# Patient Record
Sex: Male | Born: 1955 | Race: White | Hispanic: No | Marital: Married | Smoking: Former smoker
Health system: Southern US, Community
[De-identification: ages and names within clinical notes are randomized; demographics above are authoritative.]

## PROBLEM LIST (undated history)

## (undated) DIAGNOSIS — R251 Tremor, unspecified: Secondary | ICD-10-CM

## (undated) DIAGNOSIS — G709 Myoneural disorder, unspecified: Secondary | ICD-10-CM

## (undated) DIAGNOSIS — M1990 Unspecified osteoarthritis, unspecified site: Secondary | ICD-10-CM

## (undated) DIAGNOSIS — L719 Rosacea, unspecified: Secondary | ICD-10-CM

## (undated) DIAGNOSIS — G450 Vertebro-basilar artery syndrome: Secondary | ICD-10-CM

## (undated) DIAGNOSIS — I10 Essential (primary) hypertension: Secondary | ICD-10-CM

## (undated) DIAGNOSIS — E119 Type 2 diabetes mellitus without complications: Secondary | ICD-10-CM

## (undated) DIAGNOSIS — J449 Chronic obstructive pulmonary disease, unspecified: Secondary | ICD-10-CM

## (undated) DIAGNOSIS — H269 Unspecified cataract: Secondary | ICD-10-CM

## (undated) HISTORY — DX: Chronic obstructive pulmonary disease, unspecified: J44.9

## (undated) HISTORY — PX: CARPAL TUNNEL RELEASE: SHX101

## (undated) HISTORY — DX: Rosacea, unspecified: L71.9

## (undated) HISTORY — DX: Tremor, unspecified: R25.1

## (undated) HISTORY — DX: Unspecified osteoarthritis, unspecified site: M19.90

## (undated) HISTORY — PX: TONSILLECTOMY: SUR1361

## (undated) HISTORY — PX: ARTHROSCOPY, KNEE, WITH LOOSE BODY REMOVAL: SHX7683

## (undated) HISTORY — PX: EYE SURGERY: SHX253

## (undated) HISTORY — DX: Vertebro-basilar artery syndrome: G45.0

## (undated) HISTORY — PX: KNEE SURGERY: SHX244

---

## 1989-08-28 HISTORY — PX: NASAL SINUS SURGERY: SHX719

## 1993-08-28 HISTORY — PX: ANTERIOR FUSION CERVICAL SPINE: SUR626

## 2000-06-22 ENCOUNTER — Ambulatory Visit (HOSPITAL_BASED_OUTPATIENT_CLINIC_OR_DEPARTMENT_OTHER): Admission: RE | Admit: 2000-06-22 | Discharge: 2000-06-22 | Payer: Self-pay | Admitting: Internal Medicine

## 2000-09-14 ENCOUNTER — Ambulatory Visit (HOSPITAL_COMMUNITY): Admission: RE | Admit: 2000-09-14 | Discharge: 2000-09-15 | Payer: Self-pay | Admitting: Otolaryngology

## 2002-08-13 ENCOUNTER — Encounter: Payer: Self-pay | Admitting: *Deleted

## 2002-08-13 ENCOUNTER — Observation Stay (HOSPITAL_COMMUNITY): Admission: EM | Admit: 2002-08-13 | Discharge: 2002-08-14 | Payer: Self-pay | Admitting: Emergency Medicine

## 2006-08-03 ENCOUNTER — Encounter: Admission: RE | Admit: 2006-08-03 | Discharge: 2006-08-03 | Payer: Self-pay | Admitting: Family Medicine

## 2006-08-06 ENCOUNTER — Encounter: Admission: RE | Admit: 2006-08-06 | Discharge: 2006-08-06 | Payer: Self-pay | Admitting: Family Medicine

## 2006-09-09 ENCOUNTER — Emergency Department (HOSPITAL_COMMUNITY): Admission: EM | Admit: 2006-09-09 | Discharge: 2006-09-09 | Payer: Self-pay | Admitting: Emergency Medicine

## 2006-09-11 ENCOUNTER — Ambulatory Visit: Payer: Self-pay

## 2011-04-19 ENCOUNTER — Other Ambulatory Visit: Payer: Self-pay | Admitting: Family Medicine

## 2011-04-19 DIAGNOSIS — R911 Solitary pulmonary nodule: Secondary | ICD-10-CM

## 2011-04-21 ENCOUNTER — Ambulatory Visit
Admission: RE | Admit: 2011-04-21 | Discharge: 2011-04-21 | Disposition: A | Discharge status: Home / Self Care | Payer: BC Managed Care – PPO | Source: Ambulatory Visit | Attending: Family Medicine | Admitting: Family Medicine

## 2011-04-21 DIAGNOSIS — R911 Solitary pulmonary nodule: Secondary | ICD-10-CM

## 2011-05-19 ENCOUNTER — Other Ambulatory Visit: Payer: Self-pay | Admitting: Gastroenterology

## 2013-03-01 IMAGING — CT CT CHEST W/O CM
2 of 3 series · 15 of 36 positions shown, 18 images · non-contrast
Comparison: 08/06/2006 chest CT.

CLINICAL DATA: Follow-up pulmonary nodule.  Former smoker.

CT CHEST WITHOUT CONTRAST
TECHNIQUE: Multidetector CT imaging of the chest was performed
following the standard protocol without IV contrast.

[Series 3: routine chest · axial · 0.87mm/px · z∈[-283,-3]mm · 12 of 66 slices shown, 15 images]
[im 5/66  mediastinal]
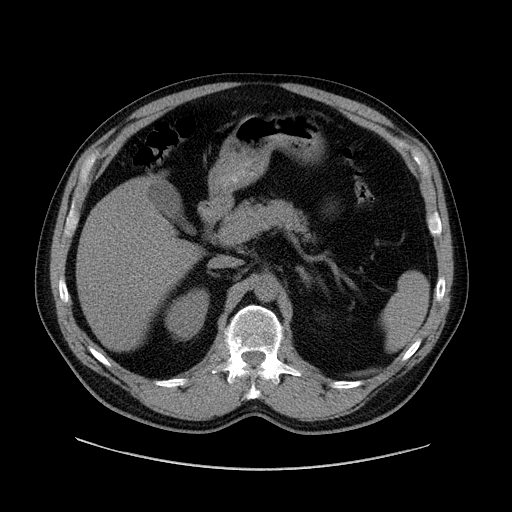
[im 5/66  lung]
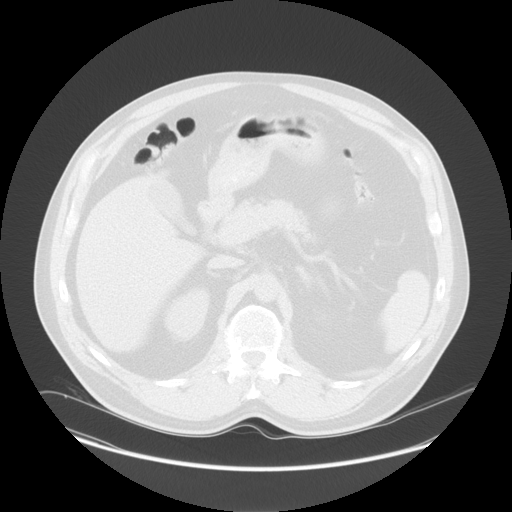
[im 10/66  lung]
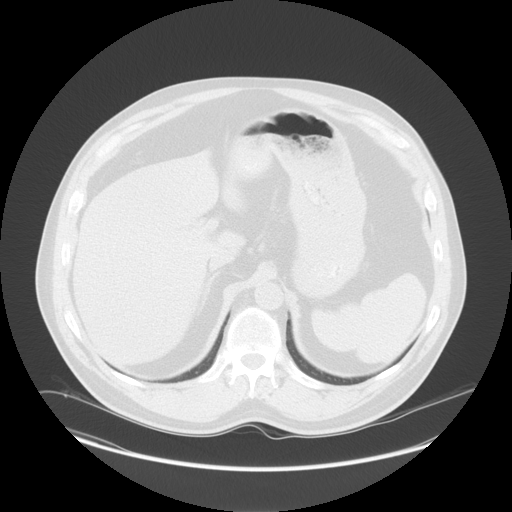
[im 15/66  lung]
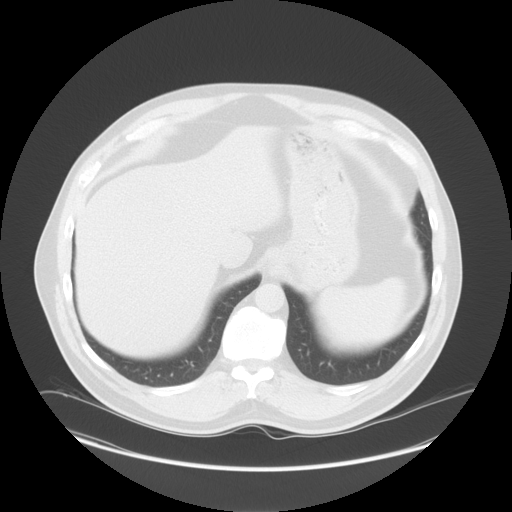
[im 20/66  lung]
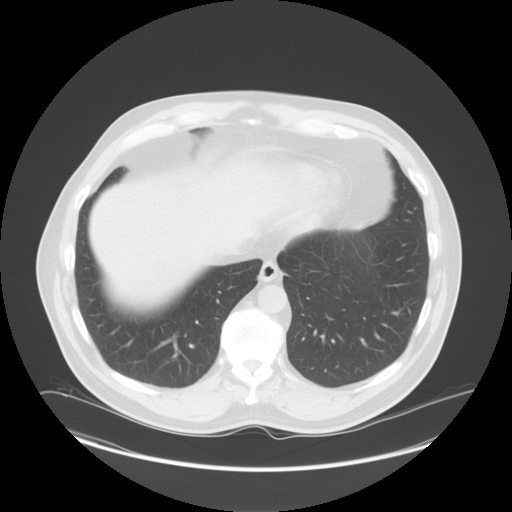
[im 25/66  mediastinal]
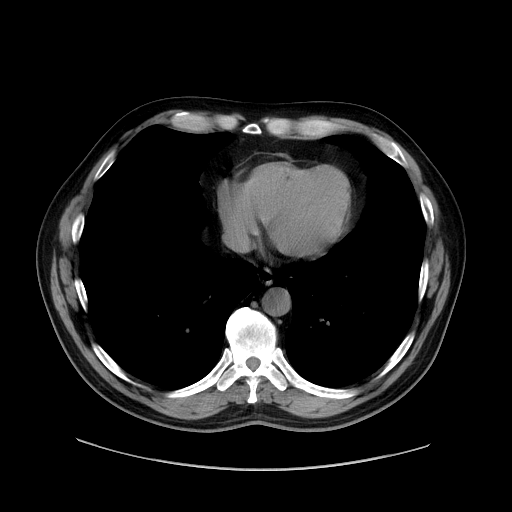
[im 25/66  lung]
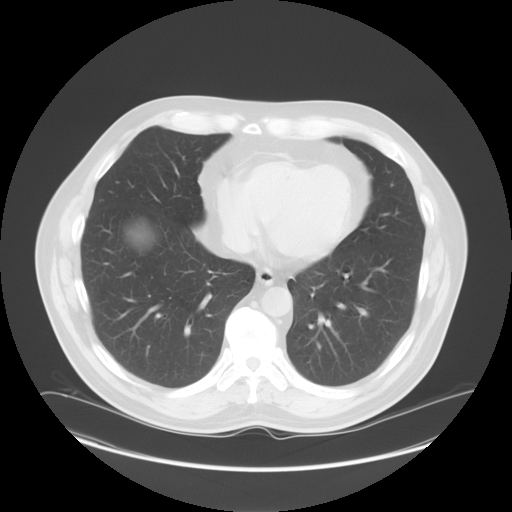
[im 29/66  lung]
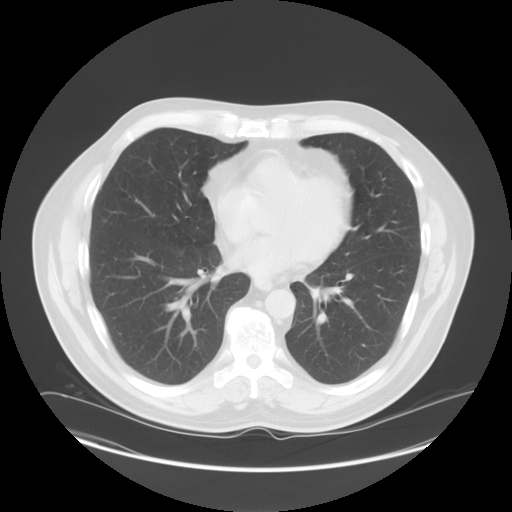
[im 37/66  lung]
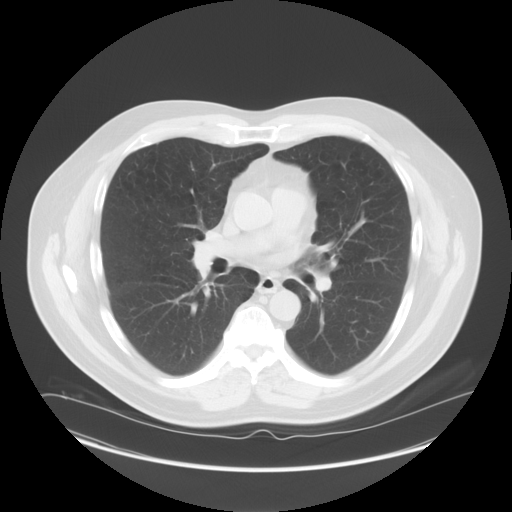
[im 41/66  lung]
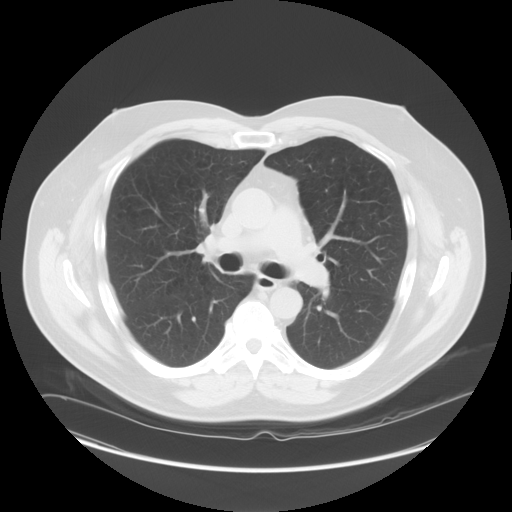
[im 46/66  mediastinal]
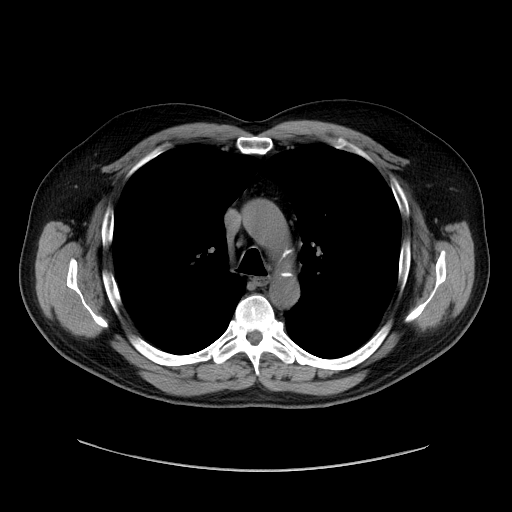
[im 46/66  lung]
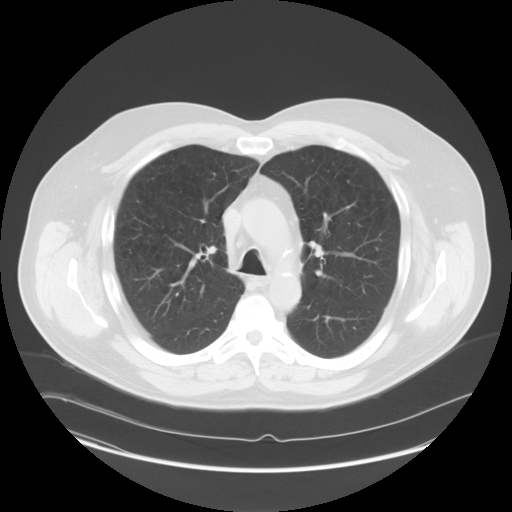
[im 51/66  lung]
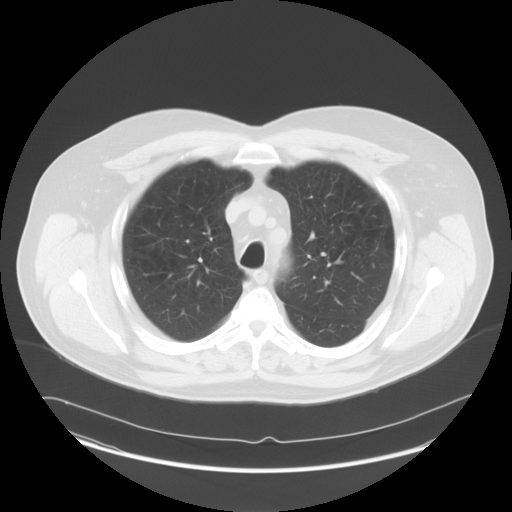
[im 56/66  lung]
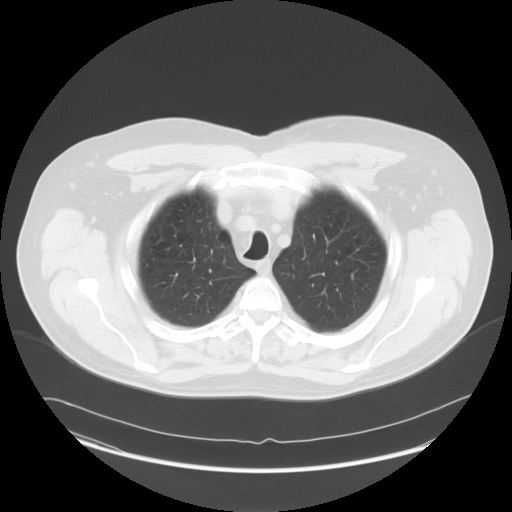
[im 61/66  lung]
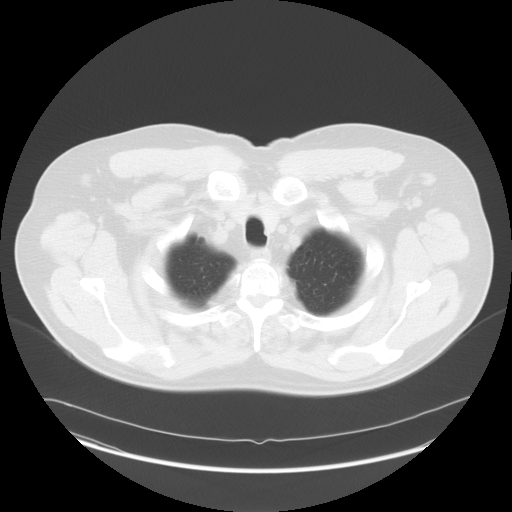

[Series 200: coronals · coronal · 0.87mm/px · 3 of 133 slices shown]
[im 27/133  lung]
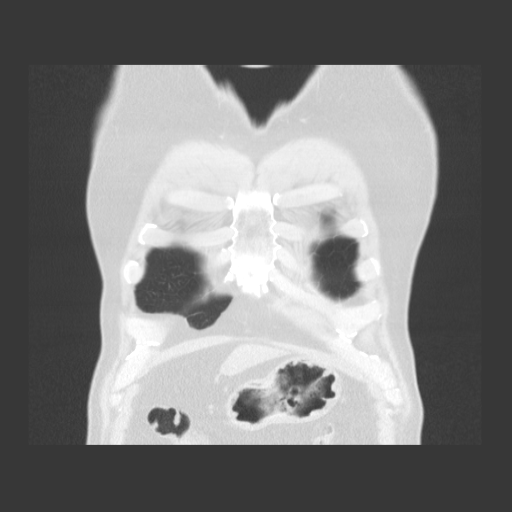
[im 53/133  lung]
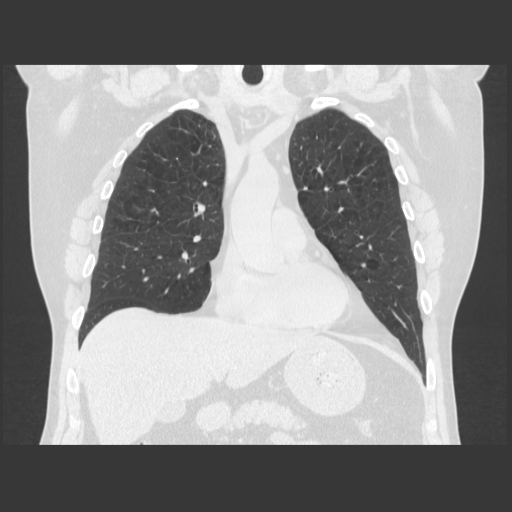
[im 80/133  lung]
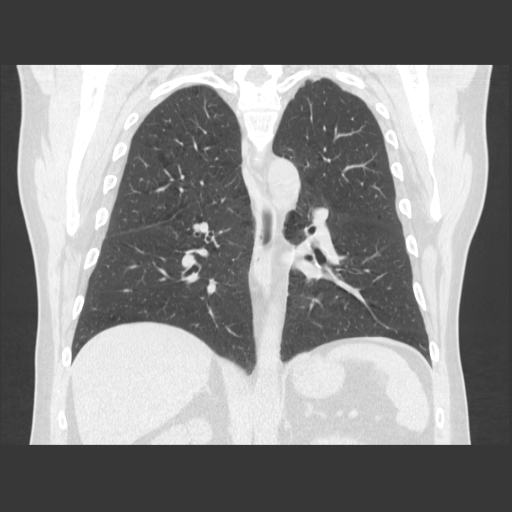

[15 of 36 positions shown; findings below may reference images not displayed]

FINDINGS: Normal sized mediastinal lymph nodes are unchanged.
There is no significant atherosclerosis.  There is no pleural or
pericardial effusion.

Mild emphysematous changes are noted throughout the lungs.  2 mm
right upper lobe nodule on image 36 is unchanged.  There is a tiny
left upper lobe nodule on image 27 which is also unchanged.  Small
focus of pleural thickening along the superior aspect of the right
major fissure on image 19 is stable.  There are no new, enlarging
or suspicious pulmonary nodules.

Images through the upper abdomen demonstrate no adrenal mass.
There is hepatic low density consistent with steatosis.
IMPRESSION: 1.  Stable mild emphysema.
2.  Stable tiny pulmonary nodules from 9664 consistent with a
benign etiology, likely postinflammatory.  No suspicious nodules.
3.  Mild hepatic steatosis.

## 2016-04-10 DIAGNOSIS — L280 Lichen simplex chronicus: Secondary | ICD-10-CM | POA: Diagnosis not present

## 2016-04-10 DIAGNOSIS — L570 Actinic keratosis: Secondary | ICD-10-CM | POA: Diagnosis not present

## 2016-04-10 DIAGNOSIS — D225 Melanocytic nevi of trunk: Secondary | ICD-10-CM | POA: Diagnosis not present

## 2016-04-10 DIAGNOSIS — D1801 Hemangioma of skin and subcutaneous tissue: Secondary | ICD-10-CM | POA: Diagnosis not present

## 2016-04-10 DIAGNOSIS — L718 Other rosacea: Secondary | ICD-10-CM | POA: Diagnosis not present

## 2016-04-10 DIAGNOSIS — L821 Other seborrheic keratosis: Secondary | ICD-10-CM | POA: Diagnosis not present

## 2016-04-10 DIAGNOSIS — D1800 Hemangioma unspecified site: Secondary | ICD-10-CM | POA: Diagnosis not present

## 2016-08-08 DIAGNOSIS — Z0000 Encounter for general adult medical examination without abnormal findings: Secondary | ICD-10-CM | POA: Diagnosis not present

## 2016-08-09 DIAGNOSIS — Z125 Encounter for screening for malignant neoplasm of prostate: Secondary | ICD-10-CM | POA: Diagnosis not present

## 2016-08-09 DIAGNOSIS — E7800 Pure hypercholesterolemia, unspecified: Secondary | ICD-10-CM | POA: Diagnosis not present

## 2016-08-09 DIAGNOSIS — Z0000 Encounter for general adult medical examination without abnormal findings: Secondary | ICD-10-CM | POA: Diagnosis not present

## 2016-11-27 DIAGNOSIS — R739 Hyperglycemia, unspecified: Secondary | ICD-10-CM | POA: Diagnosis not present

## 2016-12-25 DIAGNOSIS — Z86010 Personal history of colonic polyps: Secondary | ICD-10-CM | POA: Diagnosis not present

## 2016-12-25 DIAGNOSIS — D126 Benign neoplasm of colon, unspecified: Secondary | ICD-10-CM | POA: Diagnosis not present

## 2016-12-28 DIAGNOSIS — D126 Benign neoplasm of colon, unspecified: Secondary | ICD-10-CM | POA: Diagnosis not present

## 2017-01-06 DIAGNOSIS — J0100 Acute maxillary sinusitis, unspecified: Secondary | ICD-10-CM | POA: Diagnosis not present

## 2017-04-02 DIAGNOSIS — E7800 Pure hypercholesterolemia, unspecified: Secondary | ICD-10-CM | POA: Diagnosis not present

## 2017-04-02 DIAGNOSIS — R7303 Prediabetes: Secondary | ICD-10-CM | POA: Diagnosis not present

## 2017-04-13 DIAGNOSIS — H01004 Unspecified blepharitis left upper eyelid: Secondary | ICD-10-CM | POA: Diagnosis not present

## 2017-04-13 DIAGNOSIS — H01005 Unspecified blepharitis left lower eyelid: Secondary | ICD-10-CM | POA: Diagnosis not present

## 2019-06-13 DIAGNOSIS — D3617 Benign neoplasm of peripheral nerves and autonomic nervous system of trunk, unspecified: Secondary | ICD-10-CM | POA: Diagnosis not present

## 2019-06-13 DIAGNOSIS — L309 Dermatitis, unspecified: Secondary | ICD-10-CM | POA: Diagnosis not present

## 2019-06-13 DIAGNOSIS — L820 Inflamed seborrheic keratosis: Secondary | ICD-10-CM | POA: Diagnosis not present

## 2019-06-13 DIAGNOSIS — L570 Actinic keratosis: Secondary | ICD-10-CM | POA: Diagnosis not present

## 2019-06-13 DIAGNOSIS — D235 Other benign neoplasm of skin of trunk: Secondary | ICD-10-CM | POA: Diagnosis not present

## 2019-11-16 DIAGNOSIS — Z20828 Contact with and (suspected) exposure to other viral communicable diseases: Secondary | ICD-10-CM | POA: Diagnosis not present

## 2019-11-16 DIAGNOSIS — Z03818 Encounter for observation for suspected exposure to other biological agents ruled out: Secondary | ICD-10-CM | POA: Diagnosis not present

## 2019-11-22 DIAGNOSIS — Z03818 Encounter for observation for suspected exposure to other biological agents ruled out: Secondary | ICD-10-CM | POA: Diagnosis not present

## 2019-11-22 DIAGNOSIS — R6520 Severe sepsis without septic shock: Secondary | ICD-10-CM | POA: Diagnosis not present

## 2019-11-22 DIAGNOSIS — R0600 Dyspnea, unspecified: Secondary | ICD-10-CM | POA: Diagnosis not present

## 2019-11-22 DIAGNOSIS — E871 Hypo-osmolality and hyponatremia: Secondary | ICD-10-CM | POA: Diagnosis not present

## 2019-11-22 DIAGNOSIS — Z87891 Personal history of nicotine dependence: Secondary | ICD-10-CM | POA: Diagnosis not present

## 2019-11-22 DIAGNOSIS — Z7982 Long term (current) use of aspirin: Secondary | ICD-10-CM | POA: Diagnosis not present

## 2019-11-22 DIAGNOSIS — J1282 Pneumonia due to coronavirus disease 2019: Secondary | ICD-10-CM | POA: Diagnosis not present

## 2019-11-22 DIAGNOSIS — R739 Hyperglycemia, unspecified: Secondary | ICD-10-CM | POA: Diagnosis not present

## 2019-11-22 DIAGNOSIS — J439 Emphysema, unspecified: Secondary | ICD-10-CM | POA: Diagnosis not present

## 2019-11-22 DIAGNOSIS — R7401 Elevation of levels of liver transaminase levels: Secondary | ICD-10-CM | POA: Diagnosis not present

## 2019-11-22 DIAGNOSIS — U071 COVID-19: Secondary | ICD-10-CM | POA: Diagnosis not present

## 2019-11-22 DIAGNOSIS — I499 Cardiac arrhythmia, unspecified: Secondary | ICD-10-CM | POA: Diagnosis not present

## 2019-11-22 DIAGNOSIS — J9691 Respiratory failure, unspecified with hypoxia: Secondary | ICD-10-CM | POA: Diagnosis not present

## 2019-11-22 DIAGNOSIS — A419 Sepsis, unspecified organism: Secondary | ICD-10-CM | POA: Diagnosis not present

## 2019-11-22 DIAGNOSIS — J9602 Acute respiratory failure with hypercapnia: Secondary | ICD-10-CM | POA: Diagnosis not present

## 2019-11-22 DIAGNOSIS — R069 Unspecified abnormalities of breathing: Secondary | ICD-10-CM | POA: Diagnosis not present

## 2019-11-22 DIAGNOSIS — E669 Obesity, unspecified: Secondary | ICD-10-CM | POA: Diagnosis not present

## 2019-11-22 DIAGNOSIS — R0602 Shortness of breath: Secondary | ICD-10-CM | POA: Diagnosis not present

## 2019-11-22 DIAGNOSIS — Z6829 Body mass index (BMI) 29.0-29.9, adult: Secondary | ICD-10-CM | POA: Diagnosis not present

## 2019-11-22 DIAGNOSIS — B342 Coronavirus infection, unspecified: Secondary | ICD-10-CM | POA: Diagnosis not present

## 2019-11-22 DIAGNOSIS — J80 Acute respiratory distress syndrome: Secondary | ICD-10-CM | POA: Diagnosis not present

## 2019-11-22 DIAGNOSIS — A4189 Other specified sepsis: Secondary | ICD-10-CM | POA: Diagnosis not present

## 2019-11-22 DIAGNOSIS — D649 Anemia, unspecified: Secondary | ICD-10-CM | POA: Diagnosis not present

## 2019-11-22 DIAGNOSIS — J9601 Acute respiratory failure with hypoxia: Secondary | ICD-10-CM | POA: Diagnosis not present

## 2019-11-23 DIAGNOSIS — J9601 Acute respiratory failure with hypoxia: Secondary | ICD-10-CM | POA: Diagnosis not present

## 2019-11-23 DIAGNOSIS — U071 COVID-19: Secondary | ICD-10-CM | POA: Diagnosis not present

## 2019-11-23 DIAGNOSIS — J1282 Pneumonia due to coronavirus disease 2019: Secondary | ICD-10-CM | POA: Diagnosis not present

## 2019-11-24 DIAGNOSIS — J1282 Pneumonia due to coronavirus disease 2019: Secondary | ICD-10-CM | POA: Diagnosis not present

## 2019-11-24 DIAGNOSIS — J9601 Acute respiratory failure with hypoxia: Secondary | ICD-10-CM | POA: Diagnosis not present

## 2019-11-24 DIAGNOSIS — U071 COVID-19: Secondary | ICD-10-CM | POA: Diagnosis not present

## 2019-11-29 DIAGNOSIS — J9601 Acute respiratory failure with hypoxia: Secondary | ICD-10-CM | POA: Diagnosis not present

## 2019-11-29 DIAGNOSIS — U071 COVID-19: Secondary | ICD-10-CM | POA: Diagnosis not present

## 2019-11-29 DIAGNOSIS — J1282 Pneumonia due to coronavirus disease 2019: Secondary | ICD-10-CM | POA: Diagnosis not present

## 2019-11-30 DIAGNOSIS — J439 Emphysema, unspecified: Secondary | ICD-10-CM | POA: Diagnosis not present

## 2019-11-30 DIAGNOSIS — U071 COVID-19: Secondary | ICD-10-CM | POA: Diagnosis not present

## 2019-11-30 DIAGNOSIS — J9601 Acute respiratory failure with hypoxia: Secondary | ICD-10-CM | POA: Diagnosis not present

## 2019-11-30 DIAGNOSIS — J1282 Pneumonia due to coronavirus disease 2019: Secondary | ICD-10-CM | POA: Diagnosis not present

## 2019-12-01 DIAGNOSIS — U071 COVID-19: Secondary | ICD-10-CM | POA: Diagnosis not present

## 2019-12-01 DIAGNOSIS — J1282 Pneumonia due to coronavirus disease 2019: Secondary | ICD-10-CM | POA: Diagnosis not present

## 2019-12-01 DIAGNOSIS — J439 Emphysema, unspecified: Secondary | ICD-10-CM | POA: Diagnosis not present

## 2019-12-01 DIAGNOSIS — J9601 Acute respiratory failure with hypoxia: Secondary | ICD-10-CM | POA: Diagnosis not present

## 2019-12-02 DIAGNOSIS — J1282 Pneumonia due to coronavirus disease 2019: Secondary | ICD-10-CM | POA: Diagnosis not present

## 2019-12-02 DIAGNOSIS — A419 Sepsis, unspecified organism: Secondary | ICD-10-CM | POA: Diagnosis not present

## 2019-12-02 DIAGNOSIS — U071 COVID-19: Secondary | ICD-10-CM | POA: Diagnosis not present

## 2019-12-02 DIAGNOSIS — R6520 Severe sepsis without septic shock: Secondary | ICD-10-CM | POA: Diagnosis not present

## 2019-12-08 DIAGNOSIS — U071 COVID-19: Secondary | ICD-10-CM | POA: Diagnosis not present

## 2019-12-08 DIAGNOSIS — J449 Chronic obstructive pulmonary disease, unspecified: Secondary | ICD-10-CM | POA: Diagnosis not present

## 2019-12-26 DIAGNOSIS — H524 Presbyopia: Secondary | ICD-10-CM | POA: Diagnosis not present

## 2020-01-12 DIAGNOSIS — R7303 Prediabetes: Secondary | ICD-10-CM | POA: Diagnosis not present

## 2020-01-12 DIAGNOSIS — Z0000 Encounter for general adult medical examination without abnormal findings: Secondary | ICD-10-CM | POA: Diagnosis not present

## 2020-01-12 DIAGNOSIS — Z13220 Encounter for screening for lipoid disorders: Secondary | ICD-10-CM | POA: Diagnosis not present

## 2020-01-12 DIAGNOSIS — Z125 Encounter for screening for malignant neoplasm of prostate: Secondary | ICD-10-CM | POA: Diagnosis not present

## 2020-04-28 ENCOUNTER — Ambulatory Visit: Payer: BC Managed Care – PPO | Admitting: Orthopaedic Surgery

## 2020-04-28 ENCOUNTER — Other Ambulatory Visit: Payer: Self-pay

## 2020-04-28 ENCOUNTER — Encounter: Payer: Self-pay | Admitting: Orthopaedic Surgery

## 2020-04-28 ENCOUNTER — Ambulatory Visit: Payer: Self-pay

## 2020-04-28 VITALS — Ht 75.0 in | Wt 235.0 lb

## 2020-04-28 DIAGNOSIS — G8929 Other chronic pain: Secondary | ICD-10-CM

## 2020-04-28 DIAGNOSIS — M79671 Pain in right foot: Secondary | ICD-10-CM | POA: Diagnosis not present

## 2020-04-28 DIAGNOSIS — M79674 Pain in right toe(s): Secondary | ICD-10-CM | POA: Diagnosis not present

## 2020-04-28 DIAGNOSIS — M25512 Pain in left shoulder: Secondary | ICD-10-CM | POA: Diagnosis not present

## 2020-04-28 MED ORDER — BUPIVACAINE HCL 0.5 % IJ SOLN
2.0000 mL | INTRAMUSCULAR | Status: AC | PRN
  Administered 2020-04-28: 2 mL via INTRA_ARTICULAR

## 2020-04-28 MED ORDER — METHYLPREDNISOLONE ACETATE 40 MG/ML IJ SUSP
80.0000 mg | INTRAMUSCULAR | Status: AC | PRN
  Administered 2020-04-28: 80 mg via INTRA_ARTICULAR

## 2020-04-28 MED ORDER — LIDOCAINE HCL 2 % IJ SOLN
2.0000 mL | INTRAMUSCULAR | Status: AC | PRN
  Administered 2020-04-28: 2 mL

## 2020-04-28 NOTE — Progress Notes (Signed)
Office Visit Note   Patient: Darrell Larsen           Date of Birth: Oct 03, 1955           MRN: [id number] Visit Date: 04/28/2020              Requested by: No referring provider defined for this encounter. PCP: Blair Heys, MD   Assessment & Plan: Visit Diagnoses:  1. Chronic left shoulder pain   2. Pain in right foot   3. Toe pain, chronic, right     Plan: Impingement syndrome left shoulder.  Possible rotator cuff tear.  Appears to have a hernia of fibers of the deltoid that are not symptomatic.  Will inject the subacromial space with cortisone and monitor response over the next several weeks.  If no improvement will order MRI scan.  Has underlapping of the right little toe on the fourth toe and is developed a painful corn.  Will place cotton between the 2 toes and have him tape them and monitor the his response.  Might be a candidate for possible fusion of the fourth of the fifth toe  Follow-Up Instructions: Return if symptoms worsen or fail to improve.   Orders:  Orders Placed This Encounter  Procedures  . Large Joint Inj: L subacromial bursa  . XR Shoulder Left  . XR Foot Complete Right   No orders of the defined types were placed in this encounter.     Procedures: Large Joint Inj: L subacromial bursa on 04/28/2020 11:35 AM Indications: pain and diagnostic evaluation Details: 25 G 1.5 in needle, anterolateral approach  Arthrogram: No  Medications: 2 mL lidocaine 2 %; 2 mL bupivacaine 0.5 %; 80 mg methylPREDNISolone acetate 40 MG/ML Consent was given by the patient. Immediately prior to procedure a time out was called to verify the correct patient, procedure, equipment, support staff and site/side marked as required. Patient was prepped and draped in the usual sterile fashion.       Clinical Data: No additional findings.   Subjective: Chief Complaint  Patient presents with  . Left Shoulder - Pain  Patient presents today for left shoulder pain. No known  injury. It has been hurting for at least 2-3 weeks. He has a knot on the anterior side of his shoulder. He has pain all throughout his shoulder that is worse with use. He has limited range of motion. No numbness, tingling, or weakness. He does not take anything for pain. He is right hand dominant. No previous shoulder surgery.   He also wants to have his right foot checked out today. He states that his fifth toe curls underneath the fourth toe. He said that it has been doing this for years, but worsening. He has pain with walking. No previous foot surgery.   HPI  Review of Systems   Objective: Vital Signs: Ht 6\' 3"  (1.905 m)   Wt 235 lb (106.6 kg)   BMI 29.37 kg/m   Physical Exam Constitutional:      Appearance: He is well-developed.  Eyes:     Pupils: Pupils are equal, round, and reactive to light.  Pulmonary:     Effort: Pulmonary effort is normal.  Skin:    General: Skin is warm and dry.  Neurological:     Mental Status: He is alert and oriented to person, place, and time.  Psychiatric:        Behavior: Behavior normal.     Ortho Exam left shoulder with positive impingement  and empty can testing.  Biceps intact.  Good strength and negative Speed sign.  Has a prominence of the deltoid fibers anteriorly but without any pain.  I am not sure if this is a muscle hernia or a cyst.  Again, no pain.  Minimal subacromial tenderness anteriorly.  No pain at the [facility] joint.  Does have pain with abduction in the lateral subacromial region.  Full overhead motion.  Good strength with internal and external rotation and good grip.  No crepitation with shoulder motion.  Right foot with underlapping of the fifth on the fourth toe.  Does have a painful corn on the fourth toe where it abuts against the fifth as it under laps.  Able to correct the position to neutral passively Specialty Comments:  No specialty comments available.  Imaging: XR Foot Complete Right  Result Date: 04/28/2020 Films of  the right foot were obtained in several projections.  There are degenerative changes at the first metatarsal phalangeal joint with flattening of the joint surfaces on both sides and peripheral osteophytes.  There is narrowing of the joint space.  Patient is not symptomatic in that area.  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  No acute changes  XR Shoulder Left  Result Date: 04/28/2020 Films of the left shoulder were obtained in several projections.  Humeral head is centered about the glenoid.  No ectopic calcification.  Normal space between the humeral head and the acromium.  There are some degenerative changes at the [facility] joint that are relatively mild.  Type II acromium no acute changes    PMFS History: Patient Active Problem List   Diagnosis Date Noted  . Pain in left shoulder 04/28/2020  . Toe pain, chronic, right 04/28/2020   History reviewed. No pertinent past medical history.  History reviewed. No pertinent family history.  Past Surgical History:  Procedure Laterality Date  . ANTERIOR FUSION CERVICAL SPINE  1995  . KNEE SURGERY Right 12/18/20008   Social History   Occupational History  . Not on file  Tobacco Use  . Smoking status: Never Smoker  . Smokeless tobacco: Never Used  Substance and Sexual Activity  . Alcohol use: Not on file    Comment: occasionally  . Drug use: Never  . Sexual activity: Not on file

## 2020-05-12 ENCOUNTER — Telehealth: Payer: Self-pay | Admitting: Orthopaedic Surgery

## 2020-05-12 NOTE — Telephone Encounter (Signed)
Patient's wife Boyd Kerbs called advised the cortisone injection worked and he's feeling fine. The number to contact Boyd Kerbs if needed is [phone]

## 2020-05-12 NOTE — Telephone Encounter (Signed)
thanks

## 2020-05-12 NOTE — Telephone Encounter (Signed)
FYI

## 2021-01-13 ENCOUNTER — Telehealth: Payer: Self-pay | Admitting: Orthopaedic Surgery

## 2021-01-13 NOTE — Telephone Encounter (Signed)
Pt wife called and was trying to get pt scheduled for an cortizone injection in his left shoulder (his last one was 04/28/2020). Wondering if they could be sooner than 2 weeks? Cb [phone] Let me know and I can call pt back!

## 2021-02-02 ENCOUNTER — Encounter: Payer: Self-pay | Admitting: Orthopaedic Surgery

## 2021-02-02 ENCOUNTER — Ambulatory Visit (INDEPENDENT_AMBULATORY_CARE_PROVIDER_SITE_OTHER): Payer: BC Managed Care – PPO | Admitting: Orthopaedic Surgery

## 2021-02-02 ENCOUNTER — Other Ambulatory Visit: Payer: Self-pay

## 2021-02-02 DIAGNOSIS — G8929 Other chronic pain: Secondary | ICD-10-CM | POA: Diagnosis not present

## 2021-02-02 DIAGNOSIS — M25512 Pain in left shoulder: Secondary | ICD-10-CM | POA: Diagnosis not present

## 2021-02-02 MED ORDER — LIDOCAINE HCL 2 % IJ SOLN
2.0000 mL | INTRAMUSCULAR | Status: AC | PRN
  Administered 2021-02-02: 2 mL

## 2021-02-02 MED ORDER — BUPIVACAINE HCL 0.25 % IJ SOLN
2.0000 mL | INTRAMUSCULAR | Status: AC | PRN
  Administered 2021-02-02: 2 mL via INTRA_ARTICULAR

## 2021-02-02 NOTE — Progress Notes (Signed)
Office Visit Note   Patient: Darrell Larsen           Date of Birth: 09/29/1955           MRN: [id number] Visit Date: 02/02/2021              Requested by: Blair Heys, MD [address] [place],  [place] [address] PCP: Blair Heys, MD   Assessment & Plan: Visit Diagnoses:  1. Chronic left shoulder pain     Plan: Recurrent symptoms of impingement syndrome left shoulder.  Injected with 6 betamethasone Xylocaine and Depo-Medrol in the subacromial space with immediate relief of his pain.  I also aspirated the small mass along the anterior deltoid.  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 Also suggested an MRI scan if he continues to have recurrent pain to rule out rotator cuff tear.  Follow-Up Instructions: Return if symptoms worsen or fail to improve.   Orders:  No orders of the defined types were placed in this encounter.  No orders of the defined types were placed in this encounter.     Procedures: Large Joint Inj: L subacromial bursa on 02/02/2021 9:35 AM Indications: pain and diagnostic evaluation Details: 25 G 1.5 in needle, anterolateral approach  Arthrogram: No  Medications: 2 mL lidocaine 2 %; 2 mL bupivacaine 0.25 %  12 mg of betamethasone injected into the subacromial space left shoulder with Xylocaine and Marcaine as above Consent was given by the patient. Immediately prior to procedure a time out was called to verify the correct patient, procedure, equipment, support staff and site/side marked as required. Patient was prepped and draped in the usual sterile fashion.       Clinical Data: No additional findings.   Subjective: Chief Complaint  Patient presents with  . Left Shoulder - Pain  Mr. Speiser was seen in September of last year with impingement syndrome left shoulder.  I injected the subacromial space with excellent relief of  his pain.  He has had some recurrence to the point where he is having difficulty raising his arm over his head.  I also noted a small mass along the anterior deltoid muscle inferior to the acromion by several inches.  This has not enlarged and is not symptomatic.  HPI  Review of Systems   Objective: Vital Signs: There were no vitals taken for this visit.  Physical Exam Constitutional:      Appearance: He is well-developed.  Eyes:     Pupils: Pupils are equal, round, and reactive to light.  Pulmonary:     Effort: Pulmonary effort is normal.  Skin:    General: Skin is warm and dry.  Neurological:     Mental Status: He is alert and oriented to person, place, and time.  Psychiatric:        Behavior: Behavior normal.     Ortho Exam awake alert and oriented x3.  Comfortable sitting.  Has had prior COVID but does not have any recurrent or persistent symptoms with breathing.  Positive impingement and empty can testing left shoulder with difficulty raising his arm over his head because of his pain.  There is some tenderness along the anterior lateral subacromial region.  There is about a centimeter mass along the anterior deltoid several inches inferior to the anterior chromium.  This is not symptomatic  and freely mobile.  It does not appear to be cystic.  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  Imaging: No results found.   PMFS History: Patient Active Problem List   Diagnosis Date Noted  . Pain in left shoulder 04/28/2020  . Toe pain, chronic, right 04/28/2020   History reviewed. No pertinent past medical history.  History reviewed. No pertinent family history.  Past Surgical History:  Procedure Laterality Date  . ANTERIOR FUSION CERVICAL SPINE  1995  . KNEE SURGERY Right 12/18/20008   Social History   Occupational History  . Not on file  Tobacco Use  . Smoking status: Never Smoker  .  Smokeless tobacco: Never Used  Substance and Sexual Activity  . Alcohol use: Not on file    Comment: occasionally  . Drug use: Never  . Sexual activity: Not on file     Valeria Batman, MD   Note - This record has been created using [employer].  Chart creation errors have been sought, but may not always  have been located. Such creation errors do not reflect on  the standard of medical care.

## 2021-08-03 DIAGNOSIS — L821 Other seborrheic keratosis: Secondary | ICD-10-CM | POA: Diagnosis not present

## 2021-08-03 DIAGNOSIS — B351 Tinea unguium: Secondary | ICD-10-CM | POA: Diagnosis not present

## 2021-08-03 DIAGNOSIS — L280 Lichen simplex chronicus: Secondary | ICD-10-CM | POA: Diagnosis not present

## 2021-08-03 DIAGNOSIS — L812 Freckles: Secondary | ICD-10-CM | POA: Diagnosis not present

## 2021-08-03 DIAGNOSIS — L820 Inflamed seborrheic keratosis: Secondary | ICD-10-CM | POA: Diagnosis not present

## 2021-08-31 DIAGNOSIS — B351 Tinea unguium: Secondary | ICD-10-CM | POA: Diagnosis not present

## 2021-08-31 DIAGNOSIS — Z79899 Other long term (current) drug therapy: Secondary | ICD-10-CM | POA: Diagnosis not present

## 2021-09-15 ENCOUNTER — Ambulatory Visit: Payer: Self-pay

## 2021-09-15 ENCOUNTER — Other Ambulatory Visit: Payer: Self-pay

## 2021-09-15 ENCOUNTER — Ambulatory Visit (INDEPENDENT_AMBULATORY_CARE_PROVIDER_SITE_OTHER): Payer: BC Managed Care – PPO | Admitting: Physician Assistant

## 2021-09-15 DIAGNOSIS — M25561 Pain in right knee: Secondary | ICD-10-CM | POA: Diagnosis not present

## 2021-09-15 MED ORDER — METHYLPREDNISOLONE ACETATE 40 MG/ML IJ SUSP
80.0000 mg | INTRAMUSCULAR | Status: AC | PRN
  Administered 2021-09-15: 80 mg via INTRA_ARTICULAR

## 2021-09-15 MED ORDER — LIDOCAINE HCL 1 % IJ SOLN
5.0000 mL | INTRAMUSCULAR | Status: AC | PRN
  Administered 2021-09-15: 5 mL

## 2021-09-15 NOTE — Progress Notes (Signed)
Office Visit Note   Patient: Darrell Larsen           Date of Birth: August 05, 1956           MRN: [id number] Visit Date: 09/15/2021              Requested by: Blair Heys, MD [address] [place],  [place] [address] PCP: Blair Heys, MD  Chief Complaint  Patient presents with   Right Knee - Pain      HPI: Patient is a pleasant 66 year old gentleman who presents with a chief complaint of right superior pole patella knee pain.  He had a arthroscopy of his knee several years ago with Dr. Cleophas Dunker.  Overall he wasdone fairly well.  He said he got out and twisted his knee a little bit yesterday and had acute onset of pain.  Thought he had some soft tissue swelling.  He points to the pain at the superior pole of the patella but it does radiate down into the knee joint.  Assessment & Plan: Visit Diagnoses:  1. Acute pain of right knee     Plan: Patient with history of right knee arthroscopy and from what he described some patellar instability.  He does not have a significant effusion.  Most of the tenderness is at the superior pole of the patella.  He does have good quadricep strength though he has some chronic quadricep atrophy.  We will go forward with a steroid injection into his knee to see if this is helpful.  He will follow-up with Dr. Cleophas Dunker in 2 weeks or sooner if the pain increases encouraged a knee support if that helps him topical Voltaren gel  Follow-Up Instructions: No follow-ups on file.   Ortho Exam  Patient is alert, oriented, no adenopathy, well-dressed, normal affect, normal respiratory effort. Right knee no significant effusion no bruising quadriceps complex is intact though he does have some quadricep muscle atrophy.  He is able to sustain a straight leg raise without difficulty.  Does have some pain around the superior patella also radiates down into the medial lateral joint line.  Does not have an apprehension sign.  Good varus valgus  stability  Imaging: No results found. No images are attached to the encounter.  Labs: No results found for: HGBA1C, ESRSEDRATE, CRP, LABURIC, REPTSTATUS, GRAMSTAIN, CULT, LABORGA   No results found for: ALBUMIN, PREALBUMIN, CBC  No results found for: MG No results found for: VD25OH  No results found for: PREALBUMIN No flowsheet data found.   There is no height or weight on file to calculate BMI.  Orders:  Orders Placed This Encounter  Procedures   XR Knee 1-2 Views Right   No orders of the defined types were placed in this encounter.    Procedures: Large Joint Inj: R knee on 09/15/2021 11:17 AM Indications: pain and diagnostic evaluation Details: 25 G 1.5 in needle, anteromedial approach  Arthrogram: No  Medications: 80 mg methylPREDNISolone acetate 40 MG/ML; 5 mL lidocaine 1 % Outcome: tolerated well, no immediate complications Procedure, treatment alternatives, risks and benefits explained, specific risks discussed. Consent was given by the patient.     Clinical Data: No additional findings.  ROS:  All other systems negative, except as noted in the HPI. Review of Systems  Objective: Vital Signs: There were no vitals taken for this visit.  Specialty Comments:  No specialty comments available.  PMFS History: Patient Active Problem List   Diagnosis Date Noted   Pain  in left shoulder 04/28/2020   Toe pain, chronic, right 04/28/2020   No past medical history on file.  No family history on file.  Past Surgical History:  Procedure Laterality Date   ANTERIOR FUSION CERVICAL SPINE  1995   KNEE SURGERY Right 12/18/20008   Social History   Occupational History   Not on file  Tobacco Use   Smoking status: Never   Smokeless tobacco: Never  Substance and Sexual Activity   Alcohol use: Not on file    Comment: occasionally   Drug use: Never   Sexual activity: Not on file

## 2021-10-06 ENCOUNTER — Ambulatory Visit: Payer: BC Managed Care – PPO | Admitting: Orthopaedic Surgery

## 2021-10-06 ENCOUNTER — Encounter: Payer: Self-pay | Admitting: Orthopaedic Surgery

## 2021-10-06 ENCOUNTER — Other Ambulatory Visit: Payer: Self-pay

## 2021-10-06 DIAGNOSIS — M25561 Pain in right knee: Secondary | ICD-10-CM

## 2021-10-06 DIAGNOSIS — G8929 Other chronic pain: Secondary | ICD-10-CM

## 2021-10-06 MED ORDER — BUPIVACAINE HCL 0.25 % IJ SOLN
2.0000 mL | INTRAMUSCULAR | Status: AC | PRN
  Administered 2021-10-06: 2 mL via INTRA_ARTICULAR

## 2021-10-06 MED ORDER — LIDOCAINE HCL 1 % IJ SOLN
2.0000 mL | INTRAMUSCULAR | Status: AC | PRN
  Administered 2021-10-06: 2 mL

## 2021-10-06 MED ORDER — METHYLPREDNISOLONE ACETATE 40 MG/ML IJ SUSP
80.0000 mg | INTRAMUSCULAR | Status: AC | PRN
  Administered 2021-10-06: 80 mg via INTRA_ARTICULAR

## 2021-10-06 NOTE — Progress Notes (Signed)
Office Visit Note   Patient: Darrell Larsen           Date of Birth: [birth year]           MRN: [id number] Visit Date: 10/06/2021              Requested by: Darrell Arabian, MD [address] [place],  [place] [address] PCP: Darrell Arabian, MD   Assessment & Plan: Visit Diagnoses:  1. Chronic pain of right knee     Plan: Darrell Larsen was recently seen by Darrell Larsen for evaluation of right knee pain.  Apparently at the time he was complaining of pain about the patella and a local injection was performed without much relief.  In the interim he notes he has developed some swelling of his knee with pain along the medial lateral compartment.  No instability.  He did have an effusion today and I aspirated 35 cc of clear fluid and injected cortisone.  His films were really not diagnostic as it was minimal degenerative change but I did see some small pieces of articular cartilage within the aspirin I suspect that his problem is osteoarthritis.  We will monitor his response with a cortisone hope that it makes a difference  Follow-Up Instructions: Return if symptoms worsen or fail to improve.   Orders:  No orders of the defined types were placed in this encounter.  No orders of the defined types were placed in this encounter.     Procedures: Large Joint Inj: R knee on 10/06/2021 2:57 PM Indications: pain and diagnostic evaluation Details: 25 G 1.5 in needle, anteromedial approach  Arthrogram: No  Medications: 2 mL lidocaine 1 %; 80 mg methylPREDNISolone acetate 40 MG/ML; 2 mL bupivacaine 0.25 % Aspirate: 35 mL clear Outcome: tolerated well, no immediate complications Procedure, treatment alternatives, risks and benefits explained, specific risks discussed. Consent was given by the patient. Immediately prior to procedure a time out was called to verify the correct patient, procedure, equipment, support staff and site/side marked as required. Patient was prepped and draped in the  usual sterile fashion.      Clinical Data: No additional findings.   Subjective: Chief Complaint  Patient presents with   Right Knee - Follow-up  Patient presents today for a three week follow up on his right knee. He was injected with cortisone at his last visit. He said that it made his knee worse, but then noticed minimal improvement. He has tried Voltaren gel, bracing, and ice.   HPI  Review of Systems   Objective: Vital Signs: Ht 6\' 2"  (1.88 m)    Wt 245 lb (111.1 kg)    BMI 31.46 kg/m   Physical Exam Constitutional:      Appearance: He is well-developed.  Eyes:     Pupils: Pupils are equal, round, and reactive to light.  Pulmonary:     Effort: Pulmonary effort is normal.  Skin:    General: Skin is warm and dry.  Neurological:     Mental Status: He is alert and oriented to person, place, and time.  Psychiatric:        Behavior: Behavior normal.    Ortho Exam right knee with a small effusion.  No instability and minimal medial lateral joint pain.  Full extension of flexed over 100 degrees without instability.  No popliteal pain or mass.  No calf pain no patella pain.  No patella tendon discomfort  Specialty Comments:  No specialty comments available.  Imaging:  No results found.   PMFS History: Patient Active Problem List   Diagnosis Date Noted   Pain in right knee 10/06/2021   Pain in left shoulder 04/28/2020   Toe pain, chronic, right 04/28/2020   History reviewed. No pertinent past medical history.  History reviewed. No pertinent family history.  Past Surgical History:  Procedure Laterality Date   ANTERIOR FUSION CERVICAL SPINE  1995   KNEE SURGERY Right 12/18/20008   Social History   Occupational History   Not on file  Tobacco Use   Smoking status: Never   Smokeless tobacco: Never  Substance and Sexual Activity   Alcohol use: Not on file    Comment: occasionally   Drug use: Never   Sexual activity: Not on file

## 2022-02-15 DIAGNOSIS — E7800 Pure hypercholesterolemia, unspecified: Secondary | ICD-10-CM | POA: Diagnosis not present

## 2022-02-15 DIAGNOSIS — Z0000 Encounter for general adult medical examination without abnormal findings: Secondary | ICD-10-CM | POA: Diagnosis not present

## 2022-02-15 DIAGNOSIS — G629 Polyneuropathy, unspecified: Secondary | ICD-10-CM | POA: Diagnosis not present

## 2022-02-15 DIAGNOSIS — R7303 Prediabetes: Secondary | ICD-10-CM | POA: Diagnosis not present

## 2022-02-15 DIAGNOSIS — Z125 Encounter for screening for malignant neoplasm of prostate: Secondary | ICD-10-CM | POA: Diagnosis not present

## 2022-04-03 ENCOUNTER — Other Ambulatory Visit: Payer: Self-pay | Admitting: *Deleted

## 2022-04-03 ENCOUNTER — Encounter: Payer: Self-pay | Admitting: *Deleted

## 2022-04-05 ENCOUNTER — Encounter: Payer: Self-pay | Admitting: *Deleted

## 2022-04-05 ENCOUNTER — Ambulatory Visit: Payer: BC Managed Care – PPO | Admitting: Diagnostic Neuroimaging

## 2022-04-05 VITALS — BP 120/72 | HR 58 | Ht 75.0 in | Wt 247.0 lb

## 2022-04-05 DIAGNOSIS — G250 Essential tremor: Secondary | ICD-10-CM | POA: Diagnosis not present

## 2022-04-05 NOTE — Patient Instructions (Signed)
  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

## 2022-04-05 NOTE — Progress Notes (Unsigned)
[facility]  PATIENT: Darrell Larsen DOB: [birth year]  REFERRING CLINICIAN: Blair Heys, MD HISTORY FROM: patient  REASON FOR VISIT: new consult    HISTORICAL  CHIEF COMPLAINT:  Chief Complaint  Patient presents with   New Patient (Initial Visit)    Pt is well. He states he's been experiencing the tremors for a few years just recently started getting worse. Room 7 with wife     HISTORY OF PRESENT ILLNESS:   66 year old male here for evaluation of tremor.  Symptoms started about 5 years ago.  Gradual onset progressive tremor in the right hand with certain actions and postures.  Family history of tremor in sister and mother.  No gait or balance difficulties.  No other issues.    REVIEW OF SYSTEMS: Full 14 system review of systems performed and negative with exception of: as per HPI.  ALLERGIES: Allergies  Allergen Reactions   Naproxen     Severe pains   Nsaids     Other reaction(s): Other (See Comments) Chesty pains    HOME MEDICATIONS: Outpatient Medications Prior to Visit  Medication Sig Dispense Refill   Ascorbic Acid (VITAMIN C) 500 MG CAPS Take by mouth.     ELDERBERRY PO Take by mouth.     ibuprofen (ADVIL) 400 MG tablet Take 400 mg by mouth every 6 (six) hours as needed.     metroNIDAZOLE (METROGEL) 0.75 % gel Apply 1 application topically 2 (two) times daily.     MULTIPLE VITAMIN PO Take by mouth.     triamcinolone cream (KENALOG) 0.1 % Apply 1 Application topically 2 (two) times daily.     Ascorbic Acid (VITAMIN C) 100 MG tablet Take 100 mg by mouth daily. (Patient not taking: Reported on 04/05/2022)     aspirin EC 81 MG tablet Take 81 mg by mouth daily. (Patient not taking: Reported on 04/05/2022)     mometasone (ELOCON) 0.1 % cream Apply 1 application topically daily. (Patient not taking: Reported on 04/05/2022)     Multiple Vitamins-Minerals (EMERGEN-C VITAMIN C) CHEW See admin instructions. (Patient not taking: Reported on 04/05/2022)      No facility-administered medications prior to visit.    PAST MEDICAL HISTORY: Past Medical History:  Diagnosis Date   Arthritis    COPD (chronic obstructive pulmonary disease) (HCC)    Rosacea    Tremor    Vertebral artery syndrome     PAST SURGICAL HISTORY: Past Surgical History:  Procedure Laterality Date   ANTERIOR FUSION CERVICAL SPINE  1995   CARPAL TUNNEL RELEASE     KNEE SURGERY Right 12/18/20008    FAMILY HISTORY: No family history on file.  SOCIAL HISTORY: Social History   Socioeconomic History   Marital status: Married    Spouse name: Boyd Kerbs   Number of children: 1   Years of education: 12   Highest education level: Not on file  Occupational History    Comment: truck driver  Tobacco Use   Smoking status: Former    Types: Cigarettes    Quit date: 06/28/2010    Years since quitting: 11.7   Smokeless tobacco: Never  Substance and Sexual Activity   Alcohol use: Yes    Comment: 3-4 beers a month   Drug use: Never   Sexual activity: Not on file  Other Topics Concern   Not on file  Social History Narrative   Lives with wife   Caffeine- 1 coffee, occas soda   Social Determinants of Health   Financial  Resource Strain: Not on file  Food Insecurity: Not on file  Transportation Needs: Not on file  Physical Activity: Not on file  Stress: Not on file  Social Connections: Not on file  Intimate Partner Violence: Not on file    PHYSICAL EXAM  GENERAL EXAM/CONSTITUTIONAL: Vitals:  Vitals:   04/05/22 0846  BP: 120/72  Pulse: (!) 58  Weight: 247 lb (112 kg)  Height: 6\' 3"  (1.905 m)   Body mass index is 30.87 kg/m. Wt Readings from Last 3 Encounters:  04/05/22 247 lb (112 kg)  10/06/21 245 lb (111.1 kg)  04/28/20 235 lb (106.6 kg)   Patient is in no distress; well developed, nourished and groomed; neck is supple  CARDIOVASCULAR: Examination of carotid arteries is normal; no carotid bruits Regular rate and rhythm, no murmurs Examination of  peripheral vascular system by observation and palpation is normal  EYES: Ophthalmoscopic exam of optic discs and posterior segments is normal; no papilledema or hemorrhages No results found.  MUSCULOSKELETAL: Gait, strength, tone, movements noted in Neurologic exam below  NEUROLOGIC: MENTAL STATUS:      No data to display         awake, alert, oriented to person, place and time recent and remote memory intact normal attention and concentration language fluent, comprehension intact, naming intact fund of knowledge appropriate  CRANIAL NERVE:  2nd - no papilledema on fundoscopic exam 2nd, 3rd, 4th, 6th - pupils equal and reactive to light, visual fields full to confrontation, extraocular muscles intact, no nystagmus 5th - facial sensation symmetric 7th - facial strength symmetric 8th - hearing intact 9th - palate elevates symmetrically, uvula midline 11th - shoulder shrug symmetric 12th - tongue protrusion midline  MOTOR:  normal bulk and tone, full strength in the BUE, BLE MILD POSTURAL AND ACTION TREMOR (RUE >  LUE)  SENSORY:  normal and symmetric to light touch, temperature, vibration  COORDINATION:  finger-nose-finger, fine finger movements normal  REFLEXES:  deep tendon reflexes present and symmetric  GAIT/STATION:  narrow based gait; able to tandem     DIAGNOSTIC DATA (LABS, IMAGING, TESTING) - I reviewed patient records, labs, notes, testing and imaging myself where available.  No results found for: "WBC", "HGB", "HCT", "MCV", "PLT" No results found for: "NA", "K", "CL", "CO2", "GLUCOSE", "BUN", "CREATININE", "CALCIUM", "PROT", "ALBUMIN", "AST", "ALT", "ALKPHOS", "BILITOT", "GFRNONAA", "GFRAA" No results found for: "CHOL", "HDL", "LDLCALC", "LDLDIRECT", "TRIG", "CHOLHDL" No results found for: "HGBA1C" No results found for: "VITAMINB12" No results found for: "TSH"      ASSESSMENT AND PLAN  66 y.o. year old male here with:   Dx:  1.  Essential tremor       PLAN:  ESSENTIAL TREMOR - no signs of parkinsonism - consider propranolol or primidone if symptoms worsen (patient would like to hold off for now)  Return for pending if symptoms worsen or fail to improve.    71, MD 04/05/2022, 9:41 AM Certified in Neurology, Neurophysiology and Neuroimaging  [facility] [address], [address] [place], [place] [phone]

## 2022-04-06 ENCOUNTER — Encounter: Payer: Self-pay | Admitting: Diagnostic Neuroimaging

## 2022-04-12 DIAGNOSIS — Z86010 Personal history of colonic polyps: Secondary | ICD-10-CM | POA: Diagnosis not present

## 2022-05-17 ENCOUNTER — Encounter: Payer: Self-pay | Admitting: Orthopaedic Surgery

## 2022-05-17 ENCOUNTER — Ambulatory Visit: Payer: BC Managed Care – PPO | Admitting: Orthopaedic Surgery

## 2022-05-17 DIAGNOSIS — M25561 Pain in right knee: Secondary | ICD-10-CM

## 2022-05-17 DIAGNOSIS — G8929 Other chronic pain: Secondary | ICD-10-CM | POA: Diagnosis not present

## 2022-05-17 DIAGNOSIS — S83241D Other tear of medial meniscus, current injury, right knee, subsequent encounter: Secondary | ICD-10-CM

## 2022-05-17 NOTE — Progress Notes (Signed)
Office Visit Note   Patient: Darrell Larsen           Date of Birth: 1956/06/09           MRN: [id number] Visit Date: 05/17/2022              Requested by: Blair Heys, MD [address] [place],  [place] [address] PCP: Blair Heys, MD   Assessment & Plan: Visit Diagnoses:  1. Acute medial meniscus tear of right knee, subsequent encounter   2. Chronic pain of right knee     Plan: [name] has been seen on a number of occasions for problems referable to his right knee.  He does have some very mild degenerative changes in the knee and has responded nicely to cortisone injections in the past.  His present pain is more or less localized to the posterior medial joint line.  I am concerned that he may have a meniscal tear and will order an MRI scan.  Follow-Up Instructions: Return MRI scan right knee.   Orders:  Orders Placed This Encounter  Procedures   MR Knee Right w/o contrast   No orders of the defined types were placed in this encounter.     Procedures: No procedures performed   Clinical Data: No additional findings.   Subjective: Chief Complaint  Patient presents with   Right Knee - Pain  Patient presents today for right knee pain. He said that the injection given by Dr.Nour Rodrigues did help. His last injection was given in February of this year. He has started to hurt again in the last couple weeks. His pain is worse medially. He takes Advil as needed.   HPI  Review of Systems   Objective: Vital Signs: There were no vitals taken for this visit.  Physical Exam Constitutional:      Appearance: He is well-developed.  Eyes:     Pupils: Pupils are equal, round, and reactive to light.  Pulmonary:     Effort: Pulmonary effort is normal.  Skin:    General: Skin is warm and dry.  Neurological:     Mental Status: He is alert and oriented to person, place, and time.  Psychiatric:        Behavior: Behavior normal.     Ortho Exam awake  alert and oriented x3.  Comfortable sitting.  Right knee was not hot red warm or swollen.  Most of the pain was localized on the posterior medial joint.  There is no popping or clicking.  Full extension and flexion.  No effusion.  No instability.  No pain laterally and no pain beneath the patella  Specialty Comments:  No specialty comments available.  Imaging: No results found.   PMFS History: Patient Active Problem List   Diagnosis Date Noted   Pain in right knee 10/06/2021   Pain in left shoulder 04/28/2020   Toe pain, chronic, right 04/28/2020   Past Medical History:  Diagnosis Date   Arthritis    COPD (chronic obstructive pulmonary disease) (HCC)    Rosacea    Tremor    Vertebral artery syndrome     History reviewed. No pertinent family history.  Past Surgical History:  Procedure Laterality Date   ANTERIOR FUSION CERVICAL SPINE  1995   CARPAL TUNNEL RELEASE     KNEE SURGERY Right 12/18/20008   Social History   Occupational History    Comment: truck driver  Tobacco Use   Smoking status: Former  Types: Cigarettes    Quit date: 06/28/2010    Years since quitting: 11.8   Smokeless tobacco: Never  Substance and Sexual Activity   Alcohol use: Yes    Comment: 3-4 beers a month   Drug use: Never   Sexual activity: Not on file

## 2022-05-24 ENCOUNTER — Ambulatory Visit
Admission: RE | Admit: 2022-05-24 | Discharge: 2022-05-24 | Disposition: A | Discharge status: Home / Self Care | Payer: BC Managed Care – PPO | Source: Ambulatory Visit | Attending: Physician Assistant | Admitting: Physician Assistant

## 2022-05-24 DIAGNOSIS — M23221 Derangement of posterior horn of medial meniscus due to old tear or injury, right knee: Secondary | ICD-10-CM | POA: Diagnosis not present

## 2022-05-24 DIAGNOSIS — M25461 Effusion, right knee: Secondary | ICD-10-CM | POA: Diagnosis not present

## 2022-05-24 DIAGNOSIS — S83241D Other tear of medial meniscus, current injury, right knee, subsequent encounter: Secondary | ICD-10-CM

## 2022-05-31 ENCOUNTER — Ambulatory Visit: Payer: BC Managed Care – PPO | Admitting: Orthopaedic Surgery

## 2022-05-31 ENCOUNTER — Encounter: Payer: Self-pay | Admitting: Orthopaedic Surgery

## 2022-05-31 DIAGNOSIS — M25561 Pain in right knee: Secondary | ICD-10-CM

## 2022-05-31 DIAGNOSIS — G8929 Other chronic pain: Secondary | ICD-10-CM | POA: Diagnosis not present

## 2022-05-31 MED ORDER — LIDOCAINE HCL 1 % IJ SOLN
2.0000 mL | INTRAMUSCULAR | Status: AC | PRN
  Administered 2022-05-31: 2 mL

## 2022-05-31 MED ORDER — METHYLPREDNISOLONE ACETATE 40 MG/ML IJ SUSP
80.0000 mg | INTRAMUSCULAR | Status: AC | PRN
  Administered 2022-05-31: 80 mg via INTRA_ARTICULAR

## 2022-05-31 MED ORDER — BUPIVACAINE HCL 0.25 % IJ SOLN
2.0000 mL | INTRAMUSCULAR | Status: AC | PRN
  Administered 2022-05-31: 2 mL via INTRA_ARTICULAR

## 2022-05-31 NOTE — Progress Notes (Signed)
Office Visit Note   Patient: Darrell Larsen           Date of Birth: [birth year]           MRN: [id number] Visit Date: 05/31/2022              Requested by: Gaynelle Arabian, MD [address] [place],  [place] [address] PCP: Gaynelle Arabian, MD   Assessment & Plan: Visit Diagnoses:  1. Chronic pain of right knee     Plan: Mr. Thibeau relates that his right knee issue is "about the same".  He did have an MRI scan demonstrating an oblique tear of the posterior horn of the medial meniscus extending to the inferior articular surface and free edge.  There was some peripheral meniscal extrusion.  He also had areas of partial and high-grade partial cartilage loss in all 3 compartments mostly along the medial compartment where there was high-grade cartilage loss.  Long discussion regarding the findings on the MRI scan.  It is difficult to tell whether or not his symptoms are related to the meniscal tear or the cartilage wear or both.  I think the best approach would be a cortisone injection and monitor his response.  If it does not seem to help much he might be a candidate for knee arthroscopy just to debride the meniscus.  He is fully aware that that would not treat the arthritis but he has been experiencing some pain along the posterior medial compartment.  Injection went without problem and he will let me know how he does  Follow-Up Instructions: Return if symptoms worsen or fail to improve.   Orders:  No orders of the defined types were placed in this encounter.  No orders of the defined types were placed in this encounter.     Procedures: Large Joint Inj: R knee on 05/31/2022 5:02 PM Indications: pain and diagnostic evaluation Details: 25 G 1.5 in needle, anteromedial approach  Arthrogram: No  Medications: 2 mL lidocaine 1 %; 80 mg methylPREDNISolone acetate 40 MG/ML; 2 mL bupivacaine 0.25 % Procedure, treatment alternatives, risks and benefits explained, specific risks  discussed. Consent was given by the patient. Immediately prior to procedure a time out was called to verify the correct patient, procedure, equipment, support staff and site/side marked as required. Patient was prepped and draped in the usual sterile fashion.       Clinical Data: No additional findings.   Subjective: Chief Complaint  Patient presents with   Right Knee - Follow-up    MRI review  Patient presents today for follow up on his right knee. He had an MRI and is here today to discuss those results.  HPI  Review of Systems   Objective: Vital Signs: There were no vitals taken for this visit.  Physical Exam Constitutional:      Appearance: He is well-developed.  Eyes:     Pupils: Pupils are equal, round, and reactive to light.  Pulmonary:     Effort: Pulmonary effort is normal.  Skin:    General: Skin is warm and dry.  Neurological:     Mental Status: He is alert and oriented to person, place, and time.  Psychiatric:        Behavior: Behavior normal.     Ortho Exam awake alert and oriented x3.  Comfortable sitting.  Right knee was not hot red or warm warm.  There is no effusion.  He did have some tenderness more diffusely about the  medial compartment today but still having some tenderness along the posterior medial corner.  No popping or clicking.  Full extension and flexion.  No popliteal pain or mass.  Straight leg raise negative painless range of motion of his right hip  Specialty Comments:  No specialty comments available.  Imaging: No results found.   PMFS History: Patient Active Problem List   Diagnosis Date Noted   Pain in right knee 10/06/2021   Pain in left shoulder 04/28/2020   Toe pain, chronic, right 04/28/2020   Past Medical History:  Diagnosis Date   Arthritis    COPD (chronic obstructive pulmonary disease) (HCC)    Rosacea    Tremor    Vertebral artery syndrome     History reviewed. No pertinent family history.  Past Surgical  History:  Procedure Laterality Date   ANTERIOR FUSION CERVICAL SPINE  1995   CARPAL TUNNEL RELEASE     KNEE SURGERY Right 12/18/20008   Social History   Occupational History    Comment: truck driver  Tobacco Use   Smoking status: Former    Types: Cigarettes    Quit date: 06/28/2010    Years since quitting: 11.9   Smokeless tobacco: Never  Substance and Sexual Activity   Alcohol use: Yes    Comment: 3-4 beers a month   Drug use: Never   Sexual activity: Not on file

## 2022-06-01 DIAGNOSIS — Z86010 Personal history of colonic polyps: Secondary | ICD-10-CM | POA: Diagnosis not present

## 2022-06-01 DIAGNOSIS — D125 Benign neoplasm of sigmoid colon: Secondary | ICD-10-CM | POA: Diagnosis not present

## 2022-06-01 DIAGNOSIS — D120 Benign neoplasm of cecum: Secondary | ICD-10-CM | POA: Diagnosis not present

## 2022-06-01 DIAGNOSIS — D124 Benign neoplasm of descending colon: Secondary | ICD-10-CM | POA: Diagnosis not present

## 2022-06-01 DIAGNOSIS — D123 Benign neoplasm of transverse colon: Secondary | ICD-10-CM | POA: Diagnosis not present

## 2022-06-21 ENCOUNTER — Ambulatory Visit (INDEPENDENT_AMBULATORY_CARE_PROVIDER_SITE_OTHER): Payer: BC Managed Care – PPO | Admitting: Orthopaedic Surgery

## 2022-06-21 ENCOUNTER — Encounter: Payer: Self-pay | Admitting: Orthopaedic Surgery

## 2022-06-21 DIAGNOSIS — G8929 Other chronic pain: Secondary | ICD-10-CM

## 2022-06-21 DIAGNOSIS — M25561 Pain in right knee: Secondary | ICD-10-CM

## 2022-06-21 NOTE — Progress Notes (Signed)
Office Visit Note   Patient: Darrell Larsen           Date of Birth: 03/01/1956           MRN: [id number] Visit Date: 06/21/2022              Requested by: Darrell Arabian, MD [address] [place],  [place] [address] PCP: Darrell Arabian, MD   Assessment & Plan: Visit Diagnoses:  1. Chronic pain of right knee     Plan: Darrell Larsen was seen recently for evaluation of right knee pain.  His x-rays were nondiagnostic with the possibility of some medial lateral compartment arthritis.  I injected the knee with cortisone he relates that he had several days of pain relief only to have it recur.  I also obtained an MRI scan that revealed tricompartmental degenerative changes predominantly in the patellofemoral compartment and medially where there were some areas of high-grade cartilage loss.  He also had an oblique tear of the posterior horn of the medial meniscus extending to the inferior articular surface and free edge.  There was some peripheral meniscal extrusion.  He does wear a pullover support.  He has a considerable amount of trouble when he first gets up from a sitting position either on a chair or in his truck and his knee feels stiff and sore.  He has trouble when the weather changes.  His symptoms certainly seem consistent more with arthritis than they do with a torn cartilage.  We had a long discussion for probably 45 minutes with his wife in attendance regarding the different possibilities.  He is somewhat compromised with his pain and I think that there are several options including an arthroscopy to debride the medial meniscal tear.  He knows full well that this may or may not provide little if any relief because of the remaining arthritis.  The other possibility is to consider a knee replacement.  We had a long discussion regarding both of those options.  I would like Dr. Ninfa Linden to evaluate him and see what he thinks.  All questions were answered  Follow-Up Instructions:  Return Refer to Dr. Ninfa Linden.   Orders:  No orders of the defined types were placed in this encounter.  No orders of the defined types were placed in this encounter.     Procedures: No procedures performed   Clinical Data: No additional findings.   Subjective: Chief Complaint  Patient presents with   Right Knee - Follow-up  Darrell Larsen continues to have a problem with his right knee.  He seems to focus a lot of the pain along the posterior medial corner where he had a radial oblique tear of the posterior horn of the medial meniscus by MRI scan.  However, he has a lot of trouble when he gets out of a truck or out of a sitting position with stiffness and soreness more globally.  He only had several days relief with a recent cortisone injection.  He has tried over-the-counter medicines and even Voltaren gel.  He wears a pullover knee support  HPI  Review of Systems   Objective: Vital Signs: There were no vitals taken for this visit.  Physical Exam Constitutional:      Appearance: He is well-developed.  Eyes:     Pupils: Pupils are equal, round, and reactive to light.  Pulmonary:     Effort: Pulmonary effort is normal.  Skin:    General: Skin is warm and dry.  Neurological:     Mental Status: He is alert and oriented to person, place, and time.  Psychiatric:        Behavior: Behavior normal.     Ortho Exam awake alert and oriented x3.  Comfortable sitting.  Right knee was not hot warm or red.  He does have some tenderness along the posterior medial corner in the area of the radial tear of the meniscus.  He does have patella crepitation and some pain with patella compression.  Very mild lateral joint pain and some very mild discomfort along the anterior medial joint.  No effusion.  The knee was not hot warm or red.  Full extension flexed over 105 degrees without instability.  No popliteal pain or mass.  Straight leg raise negative.  Painless range of motion of right  hip  Specialty Comments:  No specialty comments available.  Imaging: No results found.   PMFS History: Patient Active Problem List   Diagnosis Date Noted   Pain in right knee 10/06/2021   Pain in left shoulder 04/28/2020   Toe pain, chronic, right 04/28/2020   Past Medical History:  Diagnosis Date   Arthritis    COPD (chronic obstructive pulmonary disease) (HCC)    Rosacea    Tremor    Vertebral artery syndrome     History reviewed. No pertinent family history.  Past Surgical History:  Procedure Laterality Date   ANTERIOR FUSION CERVICAL SPINE  1995   CARPAL TUNNEL RELEASE     KNEE SURGERY Right 12/18/20008   Social History   Occupational History    Comment: truck driver  Tobacco Use   Smoking status: Former    Types: Cigarettes    Quit date: 06/28/2010    Years since quitting: 11.9   Smokeless tobacco: Never  Substance and Sexual Activity   Alcohol use: Yes    Comment: 3-4 beers a month   Drug use: Never   Sexual activity: Not on file     Darrell Balding, MD   Note - This record has been created using [employer].  Chart creation errors have been sought, but may not always  have been located. Such creation errors do not reflect on  the standard of medical care.

## 2022-07-05 ENCOUNTER — Ambulatory Visit (INDEPENDENT_AMBULATORY_CARE_PROVIDER_SITE_OTHER): Payer: BC Managed Care – PPO | Admitting: Orthopaedic Surgery

## 2022-07-05 DIAGNOSIS — G8929 Other chronic pain: Secondary | ICD-10-CM

## 2022-07-05 DIAGNOSIS — S83241D Other tear of medial meniscus, current injury, right knee, subsequent encounter: Secondary | ICD-10-CM | POA: Diagnosis not present

## 2022-07-05 DIAGNOSIS — M25561 Pain in right knee: Secondary | ICD-10-CM

## 2022-07-05 NOTE — Progress Notes (Signed)
Patient is a very pleasant 66 year old gentleman sent from Dr. Cleophas Dunker to evaluate and treat the patient's right knee.  He has been having locking catching with pivoting activities in that right knee for about 6 months now.  He has tried and failed all forms conservative treatment including activity modification, anti-inflammatories, and steroid injections.  He did have a arthroscopic intervention 15 years ago or more on that right knee for meniscal tear that was small.  The MRI now shows a large complex medial meniscal tear of the right knee with a flap component.  There is no full-thickness cartilage loss in the knee at all and some mild to moderate cartilage thinning of the medial compartment of the knee.  Prior to the pivoting activity they caused this issue, he had not had any knee problems other than patellofemoral crepitation.  X-rays of the right knee from earlier this year showed well-maintained medial lateral compartments but patellofemoral narrowing.  The MRI is reviewed with the patient of the right knee and there is a large complex posterior horn to mid body medial meniscal tear with a flap component of the medial meniscus.  There is no subchondral edema and no significant cartilage loss on the medial aspect of the knee.  On exam he does have a positive McMurray's sign to the medial compartment of the right knee.  There is significant pain when we go from extension to just past 9 degrees of flexion and there is a lot of guarding with the knee.  There is no effusion.  There is medial joint line tenderness as well.  His Lachman's exam is negative.  On the MRI of the ACL and PCL are intact and the lateral compartment is intact.  We went over knee surgery in detail from knee replacement to knee arthroscopy.  I would lean more toward arthroscopic intervention given the mechanical symptoms with locking catching in his right knee combined with the MRI findings showing no full-thickness cartilage loss  and a large complex medial meniscal tear with a flap component.  I went over knee model with him and described in detail what the surgery involves.  We talked about what to expect from an intraoperative and postoperative course.  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  All questions and concerns were answered and addressed.  We will work on getting this scheduled.

## 2022-07-13 ENCOUNTER — Other Ambulatory Visit: Payer: Self-pay | Admitting: Orthopaedic Surgery

## 2022-07-13 DIAGNOSIS — G8918 Other acute postprocedural pain: Secondary | ICD-10-CM | POA: Diagnosis not present

## 2022-07-13 DIAGNOSIS — X58XXXA Exposure to other specified factors, initial encounter: Secondary | ICD-10-CM | POA: Diagnosis not present

## 2022-07-13 DIAGNOSIS — M948X6 Other specified disorders of cartilage, lower leg: Secondary | ICD-10-CM | POA: Diagnosis not present

## 2022-07-13 DIAGNOSIS — S83231A Complex tear of medial meniscus, current injury, right knee, initial encounter: Secondary | ICD-10-CM | POA: Diagnosis not present

## 2022-07-13 DIAGNOSIS — X500XXA Overexertion from strenuous movement or load, initial encounter: Secondary | ICD-10-CM | POA: Diagnosis not present

## 2022-07-13 DIAGNOSIS — S83231D Complex tear of medial meniscus, current injury, right knee, subsequent encounter: Secondary | ICD-10-CM | POA: Diagnosis not present

## 2022-07-13 MED ORDER — HYDROCODONE-ACETAMINOPHEN 5-325 MG PO TABS: 1.0000 | ORAL_TABLET | Freq: Four times a day (QID) | ORAL | 0 refills | Status: DC | PRN

## 2022-07-19 ENCOUNTER — Encounter: Payer: Self-pay | Admitting: Orthopaedic Surgery

## 2022-07-19 ENCOUNTER — Ambulatory Visit (INDEPENDENT_AMBULATORY_CARE_PROVIDER_SITE_OTHER): Payer: BC Managed Care – PPO | Admitting: Orthopaedic Surgery

## 2022-07-19 DIAGNOSIS — Z9889 Other specified postprocedural states: Secondary | ICD-10-CM

## 2022-07-19 NOTE — Progress Notes (Signed)
HPI: Darrell Larsen returns today status right knee arthroscopy.  He was found to have complex medial meniscal tear.  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  Also significant medial compartmental arthritic changes.  Knee but states that overall the knee is feeling better than it was prior to surgery.  Said no fevers chills shortness of breath chest pain.  He is ambulating without any assistive device.  Physical exam: Right knee good range of motion without pain.  Port sites well approximated with interrupted nylon sutures no signs of infection.  Calf supple nontender.  Impression: Status post right knee arthroscopy  Plan: Sutures removed.  He will work on scar tissue mobilization.  Work on Dance movement psychotherapist.  Follow-up with [place] in 1 month.  He may be a candidate for supplemental injection in the future.  Questions were encouraged and answered

## 2022-08-23 ENCOUNTER — Encounter: Payer: Self-pay | Admitting: Orthopaedic Surgery

## 2022-08-23 ENCOUNTER — Telehealth: Payer: Self-pay

## 2022-08-23 ENCOUNTER — Ambulatory Visit (INDEPENDENT_AMBULATORY_CARE_PROVIDER_SITE_OTHER): Payer: BC Managed Care – PPO | Admitting: Orthopaedic Surgery

## 2022-08-23 DIAGNOSIS — Z9889 Other specified postprocedural states: Secondary | ICD-10-CM

## 2022-08-23 DIAGNOSIS — R7303 Prediabetes: Secondary | ICD-10-CM | POA: Diagnosis not present

## 2022-08-23 NOTE — Progress Notes (Signed)
Patient continues to follow-up as a relates to a right knee arthroscopy.  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  He said the knee is doing well.  We talked about hyaluronic acid to treat the pain from osteoarthritis.  At this point he feels like that is a good option.  He still has pain if he has been sitting for a long period time with the right knee.  Examination the right knee there is no effusion today.  There is some grinding of patellofemoral joint and slight varus malalignment that is correctable.  The knee is moving well but still has slight limitations in motion compared to his left knee.  It is appropriate to order hyaluronic acid at this point to treat the pain from osteoarthritis of his right knee.  We will work on getting that ordered.  He did mention 1/5 toe issue that he is having with the fifth toe curling underneath the fourth toe.  This is affecting him walking and driving.  I have recommended getting appoint with Dr. Lajoyce Corners for his assessment of this.

## 2022-08-23 NOTE — Telephone Encounter (Signed)
Please get auth for right knee gel injection 

## 2022-08-31 NOTE — Telephone Encounter (Signed)
VOB submitted for SynviscOne, right knee.  

## 2022-09-04 ENCOUNTER — Telehealth: Payer: Self-pay

## 2022-09-04 NOTE — Telephone Encounter (Signed)
Talked with patient's wife and advised her that Gilda Crease is not covered under there insurance [employer] [place].  Advised patient's wife about TriVisc and she stated that patient would like to proceed with that.  Submitted for TriVisc, right knee online through [facility] portal.   Patient's wife is aware that she will receive a call to pay for medication to be sent to our office.

## 2022-09-13 ENCOUNTER — Other Ambulatory Visit: Payer: Self-pay

## 2022-09-13 DIAGNOSIS — G8929 Other chronic pain: Secondary | ICD-10-CM

## 2022-09-14 ENCOUNTER — Encounter: Payer: Self-pay | Admitting: Orthopedic Surgery

## 2022-09-14 ENCOUNTER — Encounter: Payer: BC Managed Care – PPO | Admitting: Orthopaedic Surgery

## 2022-09-14 ENCOUNTER — Ambulatory Visit (INDEPENDENT_AMBULATORY_CARE_PROVIDER_SITE_OTHER): Payer: BC Managed Care – PPO | Admitting: Orthopedic Surgery

## 2022-09-14 ENCOUNTER — Other Ambulatory Visit: Payer: Self-pay | Admitting: Orthopedic Surgery

## 2022-09-14 ENCOUNTER — Ambulatory Visit (INDEPENDENT_AMBULATORY_CARE_PROVIDER_SITE_OTHER): Payer: BC Managed Care – PPO

## 2022-09-14 DIAGNOSIS — M2021 Hallux rigidus, right foot: Secondary | ICD-10-CM | POA: Diagnosis not present

## 2022-09-14 DIAGNOSIS — M1711 Unilateral primary osteoarthritis, right knee: Secondary | ICD-10-CM

## 2022-09-14 DIAGNOSIS — M25571 Pain in right ankle and joints of right foot: Secondary | ICD-10-CM

## 2022-09-14 DIAGNOSIS — G8929 Other chronic pain: Secondary | ICD-10-CM

## 2022-09-14 DIAGNOSIS — M25561 Pain in right knee: Secondary | ICD-10-CM | POA: Diagnosis not present

## 2022-09-14 MED ORDER — SODIUM HYALURONATE (VISCOSUP) 25 MG/2.5ML IX SOSY
25.0000 mg | PREFILLED_SYRINGE | INTRA_ARTICULAR | Status: AC | PRN
  Administered 2022-09-14: 25 mg via INTRA_ARTICULAR

## 2022-09-14 NOTE — Progress Notes (Signed)
Office Visit Note   Patient: Darrell Larsen           Date of Birth: 03-Dec-1955           MRN: [id number] Visit Date: 09/14/2022              Requested by: Gaynelle Arabian, MD [address] [place],  [place] [address] PCP: Gaynelle Arabian, MD  Chief Complaint  Patient presents with   Right Knee - Pain   Right Foot - Pain      HPI: Patient is a 67 year old gentleman who was seen for 2 separate issues.  #1 osteoarthritis right knee #2 right foot pain over the little toe right foot.  Patient states he feels like the toe is curling under.  Patient states the pain gets worse as the day progresses.  Assessment & Plan: Visit Diagnoses:  1. Pain in right ankle and joints of right foot   2. Chronic pain of right knee   3. Hallux rigidus, right foot     Plan: Recommended Achilles stretching and a stiff soled shoe or obtain a carbon plate to put in his current shoes under the orthotics.  Patient received his first hyaluronic injection.  He will follow-up with Dr. Ninfa Linden for further injections.  Follow-Up Instructions: No follow-ups on file.   Ortho Exam  Patient is alert, oriented, no adenopathy, well-dressed, normal affect, normal respiratory effort. Examination of the right foot patient has significant hallux rigidus of the great toe with dorsal osteophytic bone spurs.  The great toe MTP joint has plantarflexion of 0 degrees dorsiflexion to 20 degrees.  Patient has a good dorsalis pedis pulse.  With patient offloading the medial column he is overloading the lateral column of his foot.  He has 20 degrees dorsiflexion of the ankle past neutral.  Patient has chronic right knee pain and is seen for his first hyaluronic acid injection for the right knee.  Imaging: XR Foot Complete Right  Result Date: 09/14/2022 Three-view radiographs of the right foot shows no evidence of fractures there is osteophytic bone spurs and joint space narrowing great toe MTP joint.   No images  are attached to the encounter.  Labs: No results found for: "HGBA1C", "ESRSEDRATE", "CRP", "LABURIC", "REPTSTATUS", "GRAMSTAIN", "CULT", "LABORGA"   No results found for: "ALBUMIN", "PREALBUMIN", "CBC"  No results found for: "MG" No results found for: "VD25OH"  No results found for: "PREALBUMIN"     No data to display           There is no height or weight on file to calculate BMI.  Orders:  No orders of the defined types were placed in this encounter.  No orders of the defined types were placed in this encounter.    Procedures: Large Joint Inj: R knee on 09/14/2022 12:29 PM Indications: pain and diagnostic evaluation Details: 22 G 1.5 in needle, anteromedial approach  Arthrogram: No  Medications: 25 mg Sodium Hyaluronate (Viscosup) 25 MG/2.5ML Outcome: tolerated well, no immediate complications Procedure, treatment alternatives, risks and benefits explained, specific risks discussed. Consent was given by the patient. Immediately prior to procedure a time out was called to verify the correct patient, procedure, equipment, support staff and site/side marked as required. Patient was prepped and draped in the usual sterile fashion.      Clinical Data: No additional findings.  ROS:  All other systems negative, except as noted in the HPI. Review of Systems  Objective: Vital Signs: There were no vitals  taken for this visit.  Specialty Comments:  No specialty comments available.  PMFS History: Patient Active Problem List   Diagnosis Date Noted   Pain in right knee 10/06/2021   Pain in left shoulder 04/28/2020   Toe pain, chronic, right 04/28/2020   Past Medical History:  Diagnosis Date   Arthritis    COPD (chronic obstructive pulmonary disease) (HCC)    Rosacea    Tremor    Vertebral artery syndrome     History reviewed. No pertinent family history.  Past Surgical History:  Procedure Laterality Date   ANTERIOR FUSION CERVICAL SPINE  1995   CARPAL  TUNNEL RELEASE     KNEE SURGERY Right 12/18/20008   Social History   Occupational History    Comment: truck driver  Tobacco Use   Smoking status: Former    Types: Cigarettes    Quit date: 06/28/2010    Years since quitting: 12.2   Smokeless tobacco: Never  Substance and Sexual Activity   Alcohol use: Yes    Comment: 3-4 beers a month   Drug use: Never   Sexual activity: Not on file

## 2022-09-20 ENCOUNTER — Encounter: Payer: BC Managed Care – PPO | Admitting: Orthopaedic Surgery

## 2022-09-21 ENCOUNTER — Ambulatory Visit (INDEPENDENT_AMBULATORY_CARE_PROVIDER_SITE_OTHER): Payer: BC Managed Care – PPO | Admitting: Orthopaedic Surgery

## 2022-09-21 ENCOUNTER — Encounter: Payer: Self-pay | Admitting: Orthopaedic Surgery

## 2022-09-21 DIAGNOSIS — M25562 Pain in left knee: Secondary | ICD-10-CM | POA: Diagnosis not present

## 2022-09-21 DIAGNOSIS — M1711 Unilateral primary osteoarthritis, right knee: Secondary | ICD-10-CM | POA: Diagnosis not present

## 2022-09-21 DIAGNOSIS — G8929 Other chronic pain: Secondary | ICD-10-CM | POA: Diagnosis not present

## 2022-09-21 DIAGNOSIS — Z9889 Other specified postprocedural states: Secondary | ICD-10-CM

## 2022-09-21 MED ORDER — LIDOCAINE HCL 1 % IJ SOLN
3.0000 mL | INTRAMUSCULAR | Status: AC | PRN
  Administered 2022-09-21: 3 mL

## 2022-09-21 MED ORDER — METHYLPREDNISOLONE ACETATE 40 MG/ML IJ SUSP
40.0000 mg | INTRAMUSCULAR | Status: AC | PRN
  Administered 2022-09-21: 40 mg via INTRA_ARTICULAR

## 2022-09-21 MED ORDER — SODIUM HYALURONATE (VISCOSUP) 25 MG/2.5ML IX SOSY
25.0000 mg | PREFILLED_SYRINGE | INTRA_ARTICULAR | Status: AC | PRN
  Administered 2022-09-21: 25 mg via INTRA_ARTICULAR

## 2022-09-21 NOTE — Progress Notes (Signed)
   Procedure Note  Patient: Darrell Larsen             Date of Birth: 09-Oct-1955           MRN: [id number]             Visit Date: 09/21/2022  Procedures: Visit Diagnoses:  1. Chronic pain of right knee   2. S/P right knee arthroscopy   3. Unilateral primary osteoarthritis, right knee   4. Chronic pain of left knee     Large Joint Inj on 09/21/2022 1:21 PM Indications: diagnostic evaluation and pain Details: 22 G 1.5 in needle, superolateral approach  Arthrogram: No  Medications: 3 mL lidocaine 1 %; 40 mg methylPREDNISolone acetate 40 MG/ML Outcome: tolerated well, no immediate complications Procedure, treatment alternatives, risks and benefits explained, specific risks discussed. Consent was given by the patient. Immediately prior to procedure a time out was called to verify the correct patient, procedure, equipment, support staff and site/side marked as required. Patient was prepped and draped in the usual sterile fashion.    Large Joint Inj: R knee on 09/21/2022 1:21 PM Indications: diagnostic evaluation and pain Details: 22 G 1.5 in needle, superolateral approach  Arthrogram: No  Medications: 25 mg Sodium Hyaluronate (Viscosup) 25 MG/2.5ML Outcome: tolerated well, no immediate complications Procedure, treatment alternatives, risks and benefits explained, specific risks discussed. Consent was given by the patient. Immediately prior to procedure a time out was called to verify the correct patient, procedure, equipment, support staff and site/side marked as required. Patient was prepped and draped in the usual sterile fashion.    The patient comes in today for #2 of a series of 3 hyaluronic acid injections in the right knee to treat moderate arthritis.  This is TriVisc No. 2.  He is requesting steroid injection in his left knee today due to chronic left knee pain.  On exam both knees have slight varus malalignment.  The right knee has just a trace effusion.  The left knee does  not but he does have medial joint line tenderness.  I did place TriVisc No. 2 in his right knee without difficulty and a steroid injection in his left knee today.  Will see him back next week for injection #3 of TriVisc in the right knee.

## 2022-09-28 ENCOUNTER — Ambulatory Visit (INDEPENDENT_AMBULATORY_CARE_PROVIDER_SITE_OTHER): Payer: BC Managed Care – PPO | Admitting: Physician Assistant

## 2022-09-28 ENCOUNTER — Encounter: Payer: Self-pay | Admitting: Physician Assistant

## 2022-09-28 DIAGNOSIS — M1711 Unilateral primary osteoarthritis, right knee: Secondary | ICD-10-CM | POA: Diagnosis not present

## 2022-09-28 MED ORDER — HYALURONAN 88 MG/4ML IX SOSY
88.0000 mg | PREFILLED_SYRINGE | INTRA_ARTICULAR | Status: AC | PRN
  Administered 2022-09-28: 88 mg via INTRA_ARTICULAR

## 2022-09-28 NOTE — Progress Notes (Signed)
   Procedure Note  Patient: Darrell Larsen             Date of Birth: 1956/07/17           MRN: [id number]             Visit Date: 09/28/2022 Patient returns today for his third of 3 TriVisc injections right knee.  He states the Visco supplemental injection seems to be helping.  He has had no adverse effects.  Patient supplied his TriVisc injection through his pharmacy.  Right knee: No abnormal warmth erythema.  Good range of motion of the knee without pain.  Procedures: Visit Diagnoses:  1. Unilateral primary osteoarthritis, right knee     Large Joint Inj on 09/28/2022 5:29 PM Indications: pain Details: 22 G 1.5 in needle, anterolateral approach  Arthrogram: No  Medications: 88 mg Hyaluronan 88 MG/4ML Outcome: tolerated well, no immediate complications Procedure, treatment alternatives, risks and benefits explained, specific risks discussed. Consent was given by the patient. Immediately prior to procedure a time out was called to verify the correct patient, procedure, equipment, support staff and site/side marked as required. Patient was prepped and draped in the usual sterile fashion.     Plan: He knows to wait at least 6 months between supplemental injections.  He will follow-up with [place] as needed.  Questions encouraged and answered

## 2022-11-02 ENCOUNTER — Encounter: Payer: Self-pay | Admitting: Radiology

## 2022-12-28 ENCOUNTER — Ambulatory Visit: Payer: BC Managed Care – PPO | Admitting: Physician Assistant

## 2022-12-28 ENCOUNTER — Encounter: Payer: Self-pay | Admitting: Physician Assistant

## 2022-12-28 DIAGNOSIS — G8929 Other chronic pain: Secondary | ICD-10-CM

## 2022-12-28 DIAGNOSIS — M1711 Unilateral primary osteoarthritis, right knee: Secondary | ICD-10-CM | POA: Diagnosis not present

## 2022-12-28 DIAGNOSIS — M25562 Pain in left knee: Secondary | ICD-10-CM | POA: Diagnosis not present

## 2022-12-28 MED ORDER — METHYLPREDNISOLONE ACETATE 40 MG/ML IJ SUSP
40.0000 mg | INTRAMUSCULAR | Status: AC | PRN
  Administered 2022-12-28: 40 mg via INTRA_ARTICULAR

## 2022-12-28 MED ORDER — LIDOCAINE HCL 1 % IJ SOLN
3.0000 mL | INTRAMUSCULAR | Status: AC | PRN
  Administered 2022-12-28: 3 mL

## 2022-12-28 NOTE — Progress Notes (Signed)
   Procedure Note  Patient: Darrell Larsen             Date of Birth: [birth year]           MRN: [id number]             Visit Date: 12/28/2022 HPI: Mr. Darrell Larsen comes in today requesting injections of both knees.  He had gel injections both knees on 09/28/2022 and feels like he got about 75% relief with these.  He does note that the knee still gives way and catching at times.  No new injuries.  Describes his pain as moderate to severe.  He is wearing knee sleeves which he feels helps him to walk after sitting as he does drive truck for living long distance.  Takes Advil occasionally for the pain.  Review of systems: Negative for fevers chills.  Physical exam: General well-developed well-nourished male no acute distress mood and affect appropriate ambulates without any assistive device.  Bilateral knees good range of motion of both knees.  No abnormal warmth erythema or effusion of either knee. Procedures: Visit Diagnoses:  1. Unilateral primary osteoarthritis, right knee   2. Chronic pain of left knee     Large Joint Inj: bilateral knee on 12/28/2022 1:25 PM Indications: pain Details: 22 G 1.5 in needle, anterolateral approach  Arthrogram: No  Medications (Right): 3 mL lidocaine 1 %; 40 mg methylPREDNISolone acetate 40 MG/ML Medications (Left): 3 mL lidocaine 1 %; 40 mg methylPREDNISolone acetate 40 MG/ML Outcome: tolerated well, no immediate complications Procedure, treatment alternatives, risks and benefits explained, specific risks discussed. Consent was given by the patient. Immediately prior to procedure a time out was called to verify the correct patient, procedure, equipment, support staff and site/side marked as required. Patient was prepped and draped in the usual sterile fashion.    Plan: He knows to wait at least 3 months between cortisone injections in 6 months between viscosupplementation injections.  Follow-up with [place] as needed.  Questions were encouraged and answered at  length

## 2023-02-28 DIAGNOSIS — Z0000 Encounter for general adult medical examination without abnormal findings: Secondary | ICD-10-CM | POA: Diagnosis not present

## 2023-02-28 DIAGNOSIS — E7800 Pure hypercholesterolemia, unspecified: Secondary | ICD-10-CM | POA: Diagnosis not present

## 2023-02-28 DIAGNOSIS — R7303 Prediabetes: Secondary | ICD-10-CM | POA: Diagnosis not present

## 2023-06-07 DIAGNOSIS — D123 Benign neoplasm of transverse colon: Secondary | ICD-10-CM | POA: Diagnosis not present

## 2023-06-07 DIAGNOSIS — K621 Rectal polyp: Secondary | ICD-10-CM | POA: Diagnosis not present

## 2023-06-07 DIAGNOSIS — Z09 Encounter for follow-up examination after completed treatment for conditions other than malignant neoplasm: Secondary | ICD-10-CM | POA: Diagnosis not present

## 2023-06-07 DIAGNOSIS — Z860100 Personal history of colon polyps, unspecified: Secondary | ICD-10-CM | POA: Diagnosis not present

## 2023-08-15 ENCOUNTER — Encounter (HOSPITAL_BASED_OUTPATIENT_CLINIC_OR_DEPARTMENT_OTHER): Payer: Self-pay | Admitting: Student

## 2023-08-15 ENCOUNTER — Ambulatory Visit (HOSPITAL_BASED_OUTPATIENT_CLINIC_OR_DEPARTMENT_OTHER): Payer: BC Managed Care – PPO | Admitting: Student

## 2023-08-15 DIAGNOSIS — M1711 Unilateral primary osteoarthritis, right knee: Secondary | ICD-10-CM | POA: Diagnosis not present

## 2023-08-15 DIAGNOSIS — M25562 Pain in left knee: Secondary | ICD-10-CM | POA: Diagnosis not present

## 2023-08-15 DIAGNOSIS — G8929 Other chronic pain: Secondary | ICD-10-CM | POA: Diagnosis not present

## 2023-08-15 MED ORDER — TRIAMCINOLONE ACETONIDE 40 MG/ML IJ SUSP
2.0000 mL | INTRAMUSCULAR | Status: AC | PRN
  Administered 2023-08-15: 2 mL via INTRA_ARTICULAR

## 2023-08-15 MED ORDER — LIDOCAINE HCL 1 % IJ SOLN
4.0000 mL | INTRAMUSCULAR | Status: AC | PRN
  Administered 2023-08-15: 4 mL

## 2023-08-15 NOTE — Progress Notes (Signed)
Chief Complaint: Bilateral knee pain     History of Present Illness:    Darrell Larsen is a 67 y.o. male presenting today for evaluation of bilateral knee pain.  He has been followed by Dr. Magnus Ivan for bilateral knee osteoarthritis and underwent a medial meniscus repair last year.  He has had TriVisc injections as well as multiple rounds of cortisone.  Last cortisone injections performed on 12/28/2022 and he reports about 3 months of relief.  Rates pain level today at a 5/10.  Has noticed some popping in the right knee.  Wearing compression sleeves bilaterally.  He reports that his knees occasionally feel like they are going to buckle.  Has been taking Advil which helps manage pain.  Works as a Naval architect for [employer].   Surgical History:   Right knee arthroscopy x 2-last in 2023 for meniscal repair  PMH/PSH/Family History/Social History/Meds/Allergies:    Past Medical History:  Diagnosis Date   Arthritis    COPD (chronic obstructive pulmonary disease) (HCC)    Rosacea    Tremor    Vertebral artery syndrome    Past Surgical History:  Procedure Laterality Date   ANTERIOR FUSION CERVICAL SPINE  1995   CARPAL TUNNEL RELEASE     KNEE SURGERY Right 12/18/20008   Social History   Socioeconomic History   Marital status: Married    Spouse name: Darrell Larsen   Number of children: 1   Years of education: 12   Highest education level: Not on file  Occupational History    Comment: truck driver  Tobacco Use   Smoking status: Former    Current packs/day: 0.00    Types: Cigarettes    Quit date: 06/28/2010    Years since quitting: 13.1   Smokeless tobacco: Never  Substance and Sexual Activity   Alcohol use: Yes    Comment: 3-4 beers a month   Drug use: Never   Sexual activity: Not on file  Other Topics Concern   Not on file  Social History Narrative   Lives with wife   Caffeine- 1 coffee, occas soda   Social Drivers of Research scientist (physical sciences) Strain: Not on file  Food Insecurity: Not on file  Transportation Needs: Not on file  Physical Activity: Not on file  Stress: Not on file  Social Connections: Not on file   History reviewed. No pertinent family history. Allergies  Allergen Reactions   Naproxen     Severe pains   Nsaids     Other reaction(s): Other (See Comments) Chesty pains   Current Outpatient Medications  Medication Sig Dispense Refill   Ascorbic Acid (VITAMIN C) 100 MG tablet Take 100 mg by mouth daily.     Ascorbic Acid (VITAMIN C) 500 MG CAPS Take by mouth.     aspirin EC 81 MG tablet Take 81 mg by mouth daily.     ELDERBERRY PO Take by mouth.     ibuprofen (ADVIL) 400 MG tablet Take 400 mg by mouth every 6 (six) hours as needed.     metroNIDAZOLE (METROGEL) 0.75 % gel Apply 1 application topically 2 (two) times daily.     mometasone (ELOCON) 0.1 % cream Apply 1 application  topically daily.     MULTIPLE VITAMIN PO Take by mouth.     Multiple Vitamins-Minerals (  EMERGEN-C VITAMIN C) CHEW See admin instructions.     triamcinolone cream (KENALOG) 0.1 % Apply 1 Application topically 2 (two) times daily.     No current facility-administered medications for this visit.   No results found.  Review of Systems:   A ROS was performed including pertinent positives and negatives as documented in the HPI.  Physical Exam :   Constitutional: NAD and appears stated age Neurological: Alert and oriented Psych: Appropriate affect and cooperative There were no vitals taken for this visit.   Comprehensive Musculoskeletal Exam:    No evidence of erythema or effusions of bilateral knees.  Full active range of motion bilaterally from 0 to 120 degrees.  Positive medial joint line tenderness of the right knee.  No warmth to palpation.  Imaging:    Assessment:   67 y.o. male with chronic bilateral knee pain worse on the right.  He has had 2 right knee arthroscopies and evidence of arthritis on previous x-rays.   He did get good relief from last cortisone injections for about 3 months and is requesting to repeat these today.  Injections were performed in clinic without any complication.  He is considering total knee replacement and it seems like he may have a program available through his employer to have this fully covered.  I have recommended to follow-up with Dr. Magnus Ivan to further discuss this to ensure that he gets the best outcomes with surgery and rehab.  Patient understands he is not eligible for repeat cortisone for another 3 months.  Plan :    -Cortisone injections performed of bilateral knees today -Continue following with Dr. Magnus Ivan for management of knee osteoarthritis and potential surgical discussion    Procedure Note  Patient: Darrell Larsen             Date of Birth: Aug 30, 1955           MRN: [id number]             Visit Date: 08/15/2023  Procedures: Visit Diagnoses:  1. Unilateral primary osteoarthritis, right knee   2. Chronic pain of left knee     Large Joint Inj: bilateral knee on 08/15/2023 10:47 AM Indications: pain Details: 22 G 1.5 in needle, anterolateral approach Medications (Right): 4 mL lidocaine 1 %; 2 mL triamcinolone acetonide 40 MG/ML Medications (Left): 4 mL lidocaine 1 %; 2 mL triamcinolone acetonide 40 MG/ML Outcome: tolerated well, no immediate complications Procedure, treatment alternatives, risks and benefits explained, specific risks discussed. Consent was given by the patient. Immediately prior to procedure a time out was called to verify the correct patient, procedure, equipment, support staff and site/side marked as required. Patient was prepped and draped in the usual sterile fashion.      I personally saw and evaluated the patient, and participated in the management and treatment plan.  Hazle Nordmann, PA-C Orthopedics

## 2024-03-04 ENCOUNTER — Ambulatory Visit (INDEPENDENT_AMBULATORY_CARE_PROVIDER_SITE_OTHER): Admitting: Student

## 2024-03-04 DIAGNOSIS — M1711 Unilateral primary osteoarthritis, right knee: Secondary | ICD-10-CM | POA: Diagnosis not present

## 2024-03-04 DIAGNOSIS — M25562 Pain in left knee: Secondary | ICD-10-CM | POA: Diagnosis not present

## 2024-03-04 DIAGNOSIS — G8929 Other chronic pain: Secondary | ICD-10-CM | POA: Diagnosis not present

## 2024-03-04 MED ORDER — TRIAMCINOLONE ACETONIDE 40 MG/ML IJ SUSP
2.0000 mL | INTRAMUSCULAR | Status: AC | PRN
  Administered 2024-03-04: 2 mL via INTRA_ARTICULAR

## 2024-03-04 MED ORDER — TRIAMCINOLONE ACETONIDE 40 MG/ML IJ SUSP
2.0000 mL | INTRAMUSCULAR | Status: AC | PRN
Start: 2024-03-04 — End: 2024-03-04
  Administered 2024-03-04: 2 mL via INTRA_ARTICULAR

## 2024-03-04 MED ORDER — LIDOCAINE HCL 1 % IJ SOLN
4.0000 mL | INTRAMUSCULAR | Status: AC | PRN
  Administered 2024-03-04: 4 mL

## 2024-03-04 NOTE — Progress Notes (Signed)
 Chief Complaint: Bilateral knee pain     History of Present Illness:   03/04/24: Patient presents to clinic today for follow-up of bilateral knee pain.  Reports that injections back in December gave him between 1 to 2 months of relief.  He is experiencing continued pain worse on the right compared to the left, particularly within the medial knee.  Wears compression sleeves bilaterally which do help significantly.  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   12/28/23: SEBASTHIAN STAILEY is a 68 y.o. male presenting today for evaluation of bilateral knee pain.  He has been followed by Dr. Vernetta for bilateral knee osteoarthritis and underwent a medial meniscus repair last year.  He has had TriVisc injections as well as multiple rounds of cortisone.  Last cortisone injections performed on 12/28/2022 and he reports about 3 months of relief.  Rates pain level today at a 5/10.  Has noticed some popping in the right knee.  Wearing compression sleeves bilaterally.  He reports that his knees occasionally feel like they are going to buckle.  Has been taking Advil which helps manage pain.  Works as a Naval architect for [employer].  Surgical History:   Right knee arthroscopy x 2-last in 2023 for meniscal repair  PMH/PSH/Family History/Social History/Meds/Allergies:    Past Medical History:  Diagnosis Date   Arthritis    COPD (chronic obstructive pulmonary disease) (HCC)    Rosacea    Tremor    Vertebral artery syndrome    Past Surgical History:  Procedure Laterality Date   ANTERIOR FUSION CERVICAL SPINE  1995   CARPAL TUNNEL RELEASE     KNEE SURGERY Right 12/18/20008   Social History   Socioeconomic History   Marital status: Married    Spouse name: Santana   Number of children: 1   Years of education: 12   Highest education level: Not on file  Occupational History    Comment: truck driver  Tobacco Use   Smoking status:  Former    Current packs/day: 0.00    Types: Cigarettes    Quit date: 06/28/2010    Years since quitting: 13.6   Smokeless tobacco: Never  Substance and Sexual Activity   Alcohol use: Yes    Comment: 3-4 beers a month   Drug use: Never   Sexual activity: Not on file  Other Topics Concern   Not on file  Social History Narrative   Lives with wife   Caffeine- 1 coffee, occas soda   Social Drivers of Corporate investment banker Strain: Not on file  Food Insecurity: Not on file  Transportation Needs: Not on file  Physical Activity: Not on file  Stress: Not on file  Social Connections: Not on file   No family history on file. Allergies  Allergen Reactions   Naproxen     Severe pains   Nsaids     Other reaction(s): Other (See Comments) Chesty pains   Current Outpatient Medications  Medication Sig Dispense Refill   Ascorbic Acid (VITAMIN C) 100 MG tablet Take 100 mg by mouth daily.     Ascorbic Acid (VITAMIN C) 500 MG CAPS Take by mouth.     aspirin EC 81 MG tablet Take 81 mg by mouth daily.  ELDERBERRY PO Take by mouth.     ibuprofen (ADVIL) 400 MG tablet Take 400 mg by mouth every 6 (six) hours as needed.     metroNIDAZOLE (METROGEL) 0.75 % gel Apply 1 application topically 2 (two) times daily.     mometasone (ELOCON) 0.1 % cream Apply 1 application  topically daily.     MULTIPLE VITAMIN PO Take by mouth.     Multiple Vitamins-Minerals (EMERGEN-C VITAMIN C) CHEW See admin instructions.     triamcinolone  cream (KENALOG ) 0.1 % Apply 1 Application topically 2 (two) times daily.     No current facility-administered medications for this visit.   No results found.  Review of Systems:   A ROS was performed including pertinent positives and negatives as documented in the HPI.  Physical Exam :   Constitutional: NAD and appears stated age Neurological: Alert and oriented Psych: Appropriate affect and cooperative There were no vitals taken for this visit.   Comprehensive  Musculoskeletal Exam:    Exam of bilateral knees demonstrates right knee active range of motion from 5 to 120 degrees with with medial joint line tenderness.  Left knee ROM from -3 to 120 degrees.  No erythema or warmth noted bilaterally with no presence of significant effusions.  Stable collaterals with varus valgus stress.  Imaging:    Assessment:   68 y.o. male with history of chronic pain in both knees that continues to be more bothersome in the right than the left.  He does have history of 2 right knee arthroscopies, last in 2023.  MRI at that time also demonstrated degenerative changes most notable in the patellofemoral medial compartments.  Has been managing pain with injections as he is trying to hold off on any more surgeries until after his retirement at the end of the month.  Although he did not get significantly long-lasting relief from last round of cortisone injections, he would like to have these repeated today and I am agreeable to this in order to give some relief while following back up with Dr. Vernetta in order to discuss treatment options further.  Injections performed of both knees today without any complication.  Will plan to see him back as needed.  Plan :    - Bilateral knee cortisone injections performed today - Continue following with Dr. Vernetta for further evaluation and treatment discussion      Procedure Note  Patient: FARREL GUIMOND             Date of Birth: 04-10-1956           MRN: [id number]             Visit Date: 03/04/2024  Procedures: Visit Diagnoses:  1. Unilateral primary osteoarthritis, right knee   2. Chronic pain of left knee      Large Joint Inj: bilateral knee on 03/04/2024 1:35 PM Indications: pain Details: 22 G 1.5 in needle, anterolateral approach Medications (Right): 4 mL lidocaine  1 %; 2 mL triamcinolone  acetonide 40 MG/ML Medications (Left): 4 mL lidocaine  1 %; 2 mL triamcinolone  acetonide 40 MG/ML Outcome: tolerated well, no  immediate complications Procedure, treatment alternatives, risks and benefits explained, specific risks discussed. Consent was given by the patient. Immediately prior to procedure a time out was called to verify the correct patient, procedure, equipment, support staff and site/side marked as required. Patient was prepped and draped in the usual sterile fashion.      I personally saw and evaluated the patient, and participated in the management  and treatment plan.  Leonce Reveal, PA-C Orthopedics

## 2024-06-16 ENCOUNTER — Other Ambulatory Visit

## 2024-06-16 ENCOUNTER — Other Ambulatory Visit (INDEPENDENT_AMBULATORY_CARE_PROVIDER_SITE_OTHER): Payer: Self-pay

## 2024-06-16 ENCOUNTER — Ambulatory Visit: Admitting: Orthopaedic Surgery

## 2024-06-16 VITALS — Ht 75.0 in | Wt 255.0 lb

## 2024-06-16 DIAGNOSIS — M1711 Unilateral primary osteoarthritis, right knee: Secondary | ICD-10-CM | POA: Diagnosis not present

## 2024-06-16 DIAGNOSIS — M25561 Pain in right knee: Secondary | ICD-10-CM | POA: Diagnosis not present

## 2024-06-16 DIAGNOSIS — M25562 Pain in left knee: Secondary | ICD-10-CM

## 2024-06-16 DIAGNOSIS — G8929 Other chronic pain: Secondary | ICD-10-CM | POA: Diagnosis not present

## 2024-06-16 MED ORDER — LIDOCAINE HCL 1 % IJ SOLN
3.0000 mL | INTRAMUSCULAR | Status: AC | PRN
  Administered 2024-06-16: 3 mL

## 2024-06-16 MED ORDER — METHYLPREDNISOLONE ACETATE 40 MG/ML IJ SUSP
40.0000 mg | INTRAMUSCULAR | Status: AC | PRN
  Administered 2024-06-16: 40 mg via INTRA_ARTICULAR

## 2024-06-16 NOTE — Progress Notes (Signed)
 The patient is well-known to us .  He has been dealing with chronic knee pain for some time now with his right knee much worse than the left knee.  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  He last had steroid injections in both knees just over 3 months ago.  He is thinking about considering knee replacement surgery for the right knee.  He does wear knee sleeves and those have helped.  He is 68 years old.  On examination his right knee has just a slight effusion comparing the right and left knees.  The right knee has slight more varus malalignment on the left knee.  Both knees are good range of motion both knees have medial joint line tenderness and patellofemoral crepitation but is much worse on the right than left.  NuPrep x-rays of both knees today show tricompartment arthritis in the right knee which is significant with only mild arthritic changes of the left knee.  The alignment is more varus on the right knee than the left knee as well.  Per his request we did place a sterile injection in both knees today.  Will see him back in about 6 weeks at that point can consider scheduling him for right knee replacement surgery.  I did show him a knee replacement model and described in detail what the surgery involves.  We can discuss this again in 6 weeks depending on how he is doing and blood direction he would like to have at this point.    Procedure Note  Patient: Darrell Larsen             Date of Birth: 1955/12/23           MRN: [id number]             Visit Date: 06/16/2024  Procedures: Visit Diagnoses:  1. Chronic pain of left knee   2. Chronic pain of right knee   3. Unilateral primary osteoarthritis, right knee     Large Joint Inj: R knee on 06/16/2024 9:05 AM Indications: diagnostic evaluation and pain Details: 22 G 1.5  in needle, superolateral approach  Arthrogram: No  Medications: 3 mL lidocaine  1 %; 40 mg methylPREDNISolone  acetate 40 MG/ML Outcome: tolerated well, no immediate complications Procedure, treatment alternatives, risks and benefits explained, specific risks discussed. Consent was given by the patient. Immediately prior to procedure a time out was called to verify the correct patient, procedure, equipment, support staff and site/side marked as required. Patient was prepped and draped in the usual sterile fashion.    Large Joint Inj: L knee on 06/16/2024 9:05 AM Indications: diagnostic evaluation and pain Details: 22 G 1.5 in needle, superolateral approach  Arthrogram: No  Medications: 3 mL lidocaine  1 %; 40 mg methylPREDNISolone  acetate 40 MG/ML Outcome: tolerated well, no immediate complications Procedure, treatment alternatives, risks and benefits explained, specific risks discussed. Consent was given by the patient. Immediately prior to procedure a time out was called to verify the correct patient, procedure, equipment, support staff and site/side marked as required. Patient was prepped and draped in the usual sterile fashion.

## 2024-06-30 ENCOUNTER — Encounter: Payer: Self-pay | Admitting: Radiology

## 2024-07-28 ENCOUNTER — Encounter: Payer: Self-pay | Admitting: Orthopaedic Surgery

## 2024-07-28 ENCOUNTER — Ambulatory Visit: Admitting: Orthopaedic Surgery

## 2024-07-28 DIAGNOSIS — M1711 Unilateral primary osteoarthritis, right knee: Secondary | ICD-10-CM

## 2024-07-28 DIAGNOSIS — M25561 Pain in right knee: Secondary | ICD-10-CM | POA: Diagnosis not present

## 2024-07-28 DIAGNOSIS — G8929 Other chronic pain: Secondary | ICD-10-CM | POA: Diagnosis not present

## 2024-07-28 NOTE — Progress Notes (Signed)
 The patient is well-known to us .  He is a 68 year old gentleman with debilitating arthritis involving both his knees with the right worse than left.  We have performed arthroscopic surgery on the right knee and he has had multiple injections in that knee.  At his last visit we discussed knee replacement surgery.  The last injection only lasted just a few weeks.  At this point his right knee pain is daily and is detrimentally affecting his mobility, his quality of life and his actives daily living.  He has tried and failed all forms conservative treatment and he does wear knee sleeves on his knees.  I did review all of his past medical history and medications within epic.  He is not on blood thinning medications and has no significant active medical issues.  On exam his right knee has varus malalignment with pain in the medial joint line, lateral joint line and patellofemoral joint.  There is patellofemoral crepitation as well.  The knee is ligamentously stable.  Previous x-rays of the right knee show significant tricompartmental arthritis with varus malalignment, bone-on-bone wear in the medial compartment and patellofemoral joint.  There are osteophytes in all 3 compartments.  We did have a long and thorough discussion again about knee replacement surgery and discussed the risk and benefits of the surgery and what to expect from an intraoperative and postoperative standpoint.  All questions and concerns were addressed and answered.  Will work on getting him scheduled sometime in the near future.

## 2024-09-09 NOTE — Patient Instructions (Signed)
 SURGICAL WAITING ROOM VISITATION Patients having surgery or a procedure may have no more than 2 support people in the waiting area - these visitors may rotate in the visitor waiting room.   If the patient needs to stay at the hospital during part of their recovery, the visitor guidelines for inpatient rooms apply.  PRE-OP VISITATION  Pre-op nurse will coordinate an appropriate time for 1 support person to accompany the patient in pre-op.  This support person may not rotate.  This visitor will be contacted when the time is appropriate for the visitor to come back in the pre-op area.  Please refer to the [facility] website for the visitor guidelines for Inpatients (after your surgery is over and you are in a regular room).  Temporary Visitor Restrictions  Children ages 77 and under will not be able to visit patients in [facility] under most circumstances. Visitation is not restricted outside of hospitals unless noted otherwise in the [facility] and Location Specific Visitation Guidelines at :      [email or web address] Visitors with respiratory illnesses are discouraged from visiting and should remain at home.  You are not required to quarantine at this time prior to your surgery. However, you must do this: Hand Hygiene often Do NOT share personal items Notify your provider if you are in close contact with someone who has COVID or you develop fever 100.4 or greater, new onset of sneezing, cough, sore throat, shortness of breath or body aches.  If you test positive for Covid or have been in contact with anyone that has tested positive in the last 10 days please notify you surgeon.    Your procedure is scheduled on:  FRIDAY   09-19-24  Report to [facility] Main Entrance: [address] entrance where the [employer] is available.   Report to admitting at:  07:00   AM  Call this number if you have any questions or problems the morning of surgery [phone]  Do not eat food  after Midnight the night prior to your surgery/procedure.  After Midnight you may have the following liquids until   06:30 AM DAY OF SURGERY  Clear Liquid Diet Water Black Coffee (sugar ok, NO MILK/CREAM OR CREAMERS)  Tea (sugar ok, NO MILK/CREAM OR CREAMERS) regular and decaf                             Plain Jell-O  with no fruit (NO RED)                                           Fruit ices (not with fruit pulp, NO RED)                                     Popsicles (NO RED)                                                                  Juice: NO CITRUS JUICES: only apple, WHITE grape, WHITE cranberry Sports drinks like Gatorade or Powerade (NO RED)  The day of surgery:  Drink ONE (1) Pre-Surgery G2 at  06:30   AM the morning of surgery. Drink in one sitting. Do not sip.  This drink was given to you during your hospital pre-op appointment visit. Nothing else to drink after completing the Pre-Surgery G2 : No candy, chewing gum or throat lozenges.    FOLLOW ANY ADDITIONAL PRE OP INSTRUCTIONS YOU RECEIVED FROM YOUR SURGEON'S OFFICE!!!   Oral Hygiene is also important to reduce your risk of infection.        Remember - BRUSH YOUR TEETH THE MORNING OF SURGERY WITH YOUR REGULAR TOOTHPASTE  Do NOT smoke after Midnight the night before surgery.  STOP TAKING all Vitamins, Herbs and supplements 1 week before your surgery.   METFORMIN- Day BEFORE surgery, take as usual. DO NOT TAKE METFORMIN THE DAY OF YOUR SURGERY.   Take ONLY these medicines the morning of surgery with A SIP OF WATER: none.   You may use your Albuterol inhaler if needed. Please bring your Inhaler with you on the day of your surgery.                    You may not have any metal on your body including  jewelry, and body piercing  Do not wear  lotions, powders, cologne, or deodorant  Men may shave face and neck.  Contacts, Hearing Aids, dentures or bridgework may not be worn into surgery. DENTURES  WILL BE REMOVED PRIOR TO SURGERY PLEASE DO NOT APPLY Poly grip OR ADHESIVES!!!  You may bring a small overnight bag with you on the day of surgery, only pack items that are not valuable. [place] IS NOT RESPONSIBLE   FOR VALUABLES THAT ARE LOST OR STOLEN.   Do not bring your home medications to the hospital. The Pharmacy will dispense medications listed on your medication list to you during your admission in the Hospital.  Please read over the following fact sheets you were given: IF YOU HAVE QUESTIONS ABOUT YOUR PRE-OP INSTRUCTIONS, PLEASE CALL [phone].      Pre-operative 4 CHG Bath Instructions   You can play a key role in reducing the risk of infection after surgery. Your skin needs to be as free of germs as possible. You can reduce the number of germs on your skin by washing with CHG (chlorhexidine gluconate) soap before surgery. CHG is an antiseptic soap that kills germs and continues to kill germs even after washing.   DO NOT use if you have an allergy to chlorhexidine/CHG or antibacterial soaps. If your skin becomes reddened or irritated, stop using the CHG and notify one of our RNs at [phone]  Please shower with the CHG soap starting 4 days before surgery using the following schedule:  MONDAY  09-15-24     Do NOT use CHG soap  the morning of your                                                                                                                                 surgery.         Please keep in mind the following:  DO NOT shave, including legs and underarms, starting the day of your first shower.   You may shave your face at any point before/day of surgery.  Place clean sheets on your bed the day you start using CHG soap. Use a clean washcloth (not used since being washed) for each shower. DO NOT sleep with pets once you start using the CHG.   CHG Shower Instructions:  If you choose to wash your hair and private area, wash first with your normal shampoo/soap.  After you use shampoo/soap, rinse your hair and body thoroughly to remove shampoo/soap residue.  Turn the water OFF and apply about 3 tablespoons (45 ml) of CHG soap to a CLEAN washcloth.  Apply CHG soap ONLY FROM YOUR NECK DOWN TO YOUR TOES (washing for 3-5 minutes)  DO NOT use CHG soap on face, private areas, open wounds, or sores.  Pay special attention to the area where your surgery is being performed.  If you are having back surgery, having someone wash your back for you may be helpful. Wait 2 minutes after CHG soap is applied, then you may rinse off the CHG soap.  Pat dry with a clean towel  Put on clean clothes/pajamas   If you choose to wear lotion, please use ONLY the CHG-compatible lotions on the back of this paper.     Additional instructions for the day of surgery: DO NOT APPLY any CHG Soap,  lotions, deodorants, cologne, or perfumes on the day of surgery  Put on clean/comfortable clothes.  Brush your teeth.  Ask your nurse before applying any prescription medications to the skin.   CHG Compatible Lotions   Aveeno Moisturizing lotion  Cetaphil Moisturizing Cream  Cetaphil Moisturizing Lotion  Clairol Herbal Essence Moisturizing Lotion, Dry Skin  Clairol Herbal Essence Moisturizing Lotion, Extra Dry Skin  Clairol Herbal Essence Moisturizing Lotion, Normal Skin  Curel Age Defying Therapeutic Moisturizing Lotion with Alpha Hydroxy  Curel Extreme Care Body Lotion  Curel Soothing Hands Moisturizing Hand Lotion  Curel Therapeutic Moisturizing Cream, Fragrance-Free  Curel Therapeutic Moisturizing Lotion, Fragrance-Free  Curel Therapeutic Moisturizing Lotion, Original Formula  Eucerin Daily Replenishing Lotion  Eucerin Dry Skin Therapy Plus Alpha Hydroxy Crme  Eucerin Dry Skin Therapy Plus Alpha Hydroxy Lotion  Eucerin Original Crme  Eucerin Original  Lotion  Eucerin Plus Crme Eucerin Plus Lotion  Eucerin TriLipid Replenishing Lotion  Keri Anti-Bacterial Hand Lotion  Keri Deep Conditioning Original Lotion Dry Skin Formula Softly Scented  Keri Deep Conditioning Original Lotion, Fragrance Free Sensitive Skin Formula  Keri Lotion Fast Absorbing Fragrance Free Sensitive Skin Formula  Keri Lotion Fast Absorbing Softly Scented Dry Skin  Formula  Keri Original Lotion  Keri Skin Renewal Lotion Keri Silky Smooth Lotion  Keri Silky Smooth Sensitive Skin Lotion  Nivea Body Creamy Conditioning Oil  Nivea Body Extra Enriched Lotion  Nivea Body Original Lotion  Nivea Body Sheer Moisturizing Lotion Nivea Crme  Nivea Skin Firming Lotion  NutraDerm 30 Skin Lotion  NutraDerm Skin Lotion  NutraDerm Therapeutic Skin Cream  NutraDerm Therapeutic Skin Lotion  ProShield Protective Hand Cream  Provon moisturizing lotion   FAILURE TO FOLLOW THESE INSTRUCTIONS MAY RESULT IN THE CANCELLATION OF YOUR SURGERY  PATIENT SIGNATURE_________________________________  NURSE SIGNATURE__________________________________  ________________________________________________________________________        Nasario Exon    An incentive spirometer is a tool that can help keep your lungs clear and active. This tool measures how well you are filling your lungs with each breath. Taking long deep breaths may help reverse or decrease the chance of developing breathing (pulmonary) problems (especially infection) following: A long period of time when you are unable to move or be active. BEFORE THE PROCEDURE  If the spirometer includes an indicator to show your best effort, your nurse or respiratory therapist will set it to a desired goal. If possible, sit up straight or lean slightly forward. Try not to slouch. Hold the incentive spirometer in an upright position. INSTRUCTIONS FOR USE  Sit on the edge of your bed if possible, or sit up as far as you can in bed  or on a chair. Hold the incentive spirometer in an upright position. Breathe out normally. Place the mouthpiece in your mouth and seal your lips tightly around it. Breathe in slowly and as deeply as possible, raising the piston or the ball toward the top of the column. Hold your breath for 3-5 seconds or for as long as possible. Allow the piston or ball to fall to the bottom of the column. Remove the mouthpiece from your mouth and breathe out normally. Rest for a few seconds and repeat Steps 1 through 7 at least 10 times every 1-2 hours when you are awake. Take your time and take a few normal breaths between deep breaths. The spirometer may include an indicator to show your best effort. Use the indicator as a goal to work toward during each repetition. After each set of 10 deep breaths, practice coughing to be sure your lungs are clear. If you have an incision (the cut made at the time of surgery), support your incision when coughing by placing a pillow or rolled up towels firmly against it. Once you are able to get out of bed, walk around indoors and cough well. You may stop using the incentive spirometer when instructed by your caregiver.  RISKS AND COMPLICATIONS Take your time so you do not get dizzy or light-headed. If you are in pain, you may need to take or ask for pain medication before doing incentive spirometry. It is harder to take a deep breath if you are having pain. AFTER USE Rest and breathe slowly and easily. It can be helpful to keep track of a log of your progress. Your caregiver can provide you with a simple table to help with this. If you are using the spirometer at home, follow these instructions: SEEK MEDICAL CARE IF:  You are having difficultly using the spirometer. You have trouble using the spirometer as often as instructed. Your pain medication is not giving enough relief while using the spirometer. You develop fever of 100.5 F (38.1 C) or higher.  SEEK IMMEDIATE MEDICAL CARE IF:  You cough up bloody sputum that had not been present before. You develop fever of 102 F (38.9 C) or greater. You develop worsening pain at or near the incision site. MAKE SURE YOU:  Understand these instructions. Will watch your condition. Will get help right away if you are not doing well or get worse. Document Released: 12/25/2006 Document Revised: 11/06/2011 Document Reviewed: 02/25/2007 [facility] Patient Information 2014 [place], [place].        If you would like to see a video about joint replacement:   [email or web address]              If you would like to see a video about joint replacement:   [email or web address]

## 2024-09-09 NOTE — Progress Notes (Signed)
 COVID Vaccine received:  []  No [x]  Yes Date of any COVID positive Test in last 90 days:  PCP - Beverlie Bernhardt, MD) Darrell Costa, MD [phone]  Cardiologist -   Chest x-ray - 11-22-2019  2v  in [facility] EKG -2021 in [facility], repea\t   Stress Test -  ECHO -  Cardiac Cath -  CT Coronary Calcium score:   Pacemaker / ICD device [x]  No []  Yes   Spinal Cord Stimulator:[x]  No []  Yes       History of Sleep Apnea? [x]  No []  Yes   CPAP used?- [x]  No []  Yes    Medication on DOS: Albuterol inhaler  Patient has: []  NO Hx DM   []  Pre-DM   []  DM1  [x]   DM2 Does the patient monitor blood sugar?   []  N/A   []  No []  Yes  last A1c was: 7.1 on   03-12-24 at [facility]   Does patient have a [employer] or Dexcom? []  No []  Yes   Fasting Blood Sugar Ranges-  Checks Blood Sugar _____ times a day METFORMIN- q hs  Blood Thinner / Instructions:  none Aspirin Instructions:  none  Activity level: Able to walk up 2 flights of stairs without becoming significantly short of breath or having chest pain?   []    Yes   []  No,  would have:  Patient can perform ADLs without assistance.  []   Yes    []  No  Comments:   Anesthesia review: DM2, COPD, Essential Tremor, vertebral artery syndrome,  Hx cervical fusion [phone] (2 level),  Patient denies any S&S of respiratory illness or Covid - no shortness of breath, fever, cough or chest pain at PAT appointment.  Patient verbalized understanding and agreement to the Pre-Surgical Instructions that were given to them at this PAT appointment. Patient was also educated of the need to review these PAT instructions again prior to his surgery.I reviewed the appropriate phone numbers to call if they have any and questions or concerns.

## 2024-09-10 ENCOUNTER — Encounter (HOSPITAL_COMMUNITY): Payer: Self-pay

## 2024-09-10 ENCOUNTER — Other Ambulatory Visit: Payer: Self-pay

## 2024-09-10 ENCOUNTER — Encounter (HOSPITAL_COMMUNITY)
Admission: RE | Admit: 2024-09-10 | Discharge: 2024-09-10 | Disposition: A | Discharge status: Home / Self Care | Source: Ambulatory Visit | Attending: Orthopaedic Surgery | Admitting: Orthopaedic Surgery

## 2024-09-10 VITALS — BP 148/84 | HR 90 | Temp 98.7°F | Resp 20 | Ht 75.0 in | Wt 255.0 lb

## 2024-09-10 DIAGNOSIS — E119 Type 2 diabetes mellitus without complications: Secondary | ICD-10-CM | POA: Diagnosis not present

## 2024-09-10 DIAGNOSIS — M1711 Unilateral primary osteoarthritis, right knee: Secondary | ICD-10-CM | POA: Insufficient documentation

## 2024-09-10 DIAGNOSIS — Z01818 Encounter for other preprocedural examination: Secondary | ICD-10-CM | POA: Diagnosis present

## 2024-09-10 HISTORY — DX: Type 2 diabetes mellitus without complications: E11.9

## 2024-09-10 HISTORY — DX: Unspecified cataract: H26.9

## 2024-09-10 HISTORY — DX: Essential (primary) hypertension: I10

## 2024-09-10 HISTORY — DX: Myoneural disorder, unspecified: G70.9

## 2024-09-10 LAB — CBC
HCT: 49.8 % (ref 39.0–52.0)
Hemoglobin: 16.9 g/dL (ref 13.0–17.0)
MCH: 31.5 pg (ref 26.0–34.0)
MCHC: 33.9 g/dL (ref 30.0–36.0)
MCV: 92.9 fL (ref 80.0–100.0)
Platelets: 175 K/uL (ref 150–400)
RBC: 5.36 MIL/uL (ref 4.22–5.81)
RDW: 12.9 % (ref 11.5–15.5)
WBC: 7 K/uL (ref 4.0–10.5)
nRBC: 0 % (ref 0.0–0.2)

## 2024-09-10 LAB — BASIC METABOLIC PANEL WITH GFR
Anion gap: 11 (ref 5–15)
BUN: 12 mg/dL (ref 8–23)
CO2: 24 mmol/L (ref 22–32)
Calcium: 9.9 mg/dL (ref 8.9–10.3)
Chloride: 102 mmol/L (ref 98–111)
Creatinine, Ser: 1.04 mg/dL (ref 0.61–1.24)
GFR, Estimated: >60 mL/min
Glucose, Bld: 160 mg/dL — ABNORMAL HIGH (ref 70–99)
Potassium: 4.9 mmol/L (ref 3.5–5.1)
Sodium: 137 mmol/L (ref 135–145)

## 2024-09-10 LAB — GLUCOSE, CAPILLARY: Glucose-Capillary: 175 mg/dL — ABNORMAL HIGH (ref 70–99)

## 2024-09-10 LAB — SURGICAL PCR SCREEN
MRSA, PCR: NEGATIVE
Staphylococcus aureus: NEGATIVE

## 2024-09-10 LAB — HEMOGLOBIN A1C
Hgb A1c MFr Bld: 8.5 % — ABNORMAL HIGH (ref 4.8–5.6)
Mean Plasma Glucose: 197.25 mg/dL

## 2024-09-19 ENCOUNTER — Ambulatory Visit (HOSPITAL_COMMUNITY): Admit: 2024-09-19 | Admitting: Orthopaedic Surgery

## 2024-09-19 ENCOUNTER — Encounter (HOSPITAL_COMMUNITY): Admission: RE | Payer: Self-pay | Source: Ambulatory Visit

## 2024-09-19 DIAGNOSIS — M1711 Unilateral primary osteoarthritis, right knee: Secondary | ICD-10-CM

## 2024-09-19 SURGERY — ARTHROPLASTY, KNEE, TOTAL
Anesthesia: Spinal | Site: Knee | Laterality: Right

## 2024-10-02 ENCOUNTER — Encounter: Admitting: Orthopaedic Surgery
